# Patient Record
Sex: Male | Born: 1955 | Race: White | Hispanic: No | Marital: Married | Smoking: Never smoker
Health system: Southern US, Community
[De-identification: ages and names within clinical notes are randomized; demographics above are authoritative.]

## PROBLEM LIST (undated history)

## (undated) DIAGNOSIS — M109 Gout, unspecified: Secondary | ICD-10-CM

## (undated) DIAGNOSIS — E785 Hyperlipidemia, unspecified: Secondary | ICD-10-CM

## (undated) DIAGNOSIS — I2510 Atherosclerotic heart disease of native coronary artery without angina pectoris: Secondary | ICD-10-CM

## (undated) DIAGNOSIS — I10 Essential (primary) hypertension: Secondary | ICD-10-CM

## (undated) HISTORY — PX: CARDIAC CATHETERIZATION: SHX172

## (undated) HISTORY — DX: Hyperlipidemia, unspecified: E78.5

## (undated) HISTORY — DX: Atherosclerotic heart disease of native coronary artery without angina pectoris: I25.10

## (undated) HISTORY — PX: OTHER SURGICAL HISTORY: SHX169

## (undated) HISTORY — DX: Gout, unspecified: M10.9

---

## 2001-09-25 ENCOUNTER — Ambulatory Visit (HOSPITAL_COMMUNITY): Admission: RE | Admit: 2001-09-25 | Discharge: 2001-09-25 | Payer: Self-pay | Admitting: Family Medicine

## 2001-09-25 ENCOUNTER — Encounter: Payer: Self-pay | Admitting: Family Medicine

## 2001-11-21 ENCOUNTER — Inpatient Hospital Stay (HOSPITAL_COMMUNITY): Admission: AD | Admit: 2001-11-21 | Discharge: 2001-11-25 | Payer: Self-pay | Admitting: *Deleted

## 2008-09-07 ENCOUNTER — Ambulatory Visit (HOSPITAL_COMMUNITY): Admission: AD | Admit: 2008-09-07 | Discharge: 2008-09-08 | Payer: Self-pay | Admitting: Interventional Cardiology

## 2011-02-20 NOTE — Cardiovascular Report (Signed)
NAMEKEMET, NIJJAR NO.:  [id number]   MEDICAL RECORD NO.:  [id number]          PATIENT TYPE:  INP   LOCATION:  6524                         FACILITY:  [facility]   PHYSICIAN:  Corky Crafts, MDDATE OF BIRTH:  1956/08/09   DATE OF PROCEDURE:  09/07/2008  DATE OF DISCHARGE:                            CARDIAC CATHETERIZATION   PRIMARY CARE PHYSICIAN AND REFERRING PHYSICIAN:  Dario Guardian, MD   PROCEDURES PERFORMED:  1. Left heart catheterization.  2. Left ventriculogram.  3. Abdominal aortogram.  4. Coronary angiogram.  5. Percutaneous coronary intervention of the right coronary artery.   OPERATOR:  Corky Crafts, MD   INDICATIONS:  Angina and coronary artery disease.   PROCEDURE/NARRATIVE:  The risks and benefits of cardiac catheterization  were explained to the patient and informed consent was obtained.  The  patient was brought to the cath lab.  He was prepped and draped in the  usual sterile fashion.  His right groin was infiltrated with 1%  lidocaine.  A 6-French sheath was placed into the right femoral artery  using the modified Seldinger technique.  Left coronary artery  angiography was performed using a JL-4.0 catheter.  The catheter was  advanced to the vessel ostium under fluoroscopic guidance.  Digital  angiography was performed in multiple projections using hand injection  of contrast.  Right coronary artery angiography was performed using a JR-  4.0 catheter in a similar fashion.  A pigtail catheter was advanced to  the ascending aorta and across the aortic valve under fluoroscopic  guidance.  A power injection of contrast was performed in the RAO  position to image the left ventricle.  The catheter was pulled back  under continuous hemodynamic pressure monitoring.  The catheter was then  withdrawn to the abdominal aorta, and a power injection of contrast was  performed in the AP projection.  The PCI was then performed and the  sheath will then be removed using manual compression.  Angiomax was used  for anticoagulation.   FINDINGS:  The left main was widely patent.  There was minimal  irregularity in the distal left main.  The left circumflex was a large vessel.  In the midportion of the  vessel, there was a stent, which was patent, but had about 20-30% in-  stent restenosis.  The OM-1 and OM-2 were small vessels, but widely  patent.  The ramus was a large vessel with mild irregularities.  The left anterior descending was a large vessel, which extended to the  apex, which had only mild irregularities.  The first and second  diagonals were small vessels, but widely patent.  The right coronary artery was a dominant vessel and medium sized.  In  the midportion of the vessel, there was a 95% stenosis.  The distal  vessel appeared underfilled.  left ventriculogram showed normal left ventricular function with an  estimated ejection fraction of 55-60%.   HEMODYNAMICS:  Left ventricular end-diastolic pressure of 10 mmHg.  There was no aortic valve gradient.   PCI NARRATIVE:  A JR-4 guiding catheter was used to engage the right  coronary artery.  A Prowater wire was placed across the stenosis.  A 2.5  x 12-mm Sprinter balloon was placed across the lesion and inflated to 10  atmospheres for 25 seconds.  A 2.5 x 24-mm Endeavor stent was then  placed across the lesion and deployed at 9 atmospheres for 40 seconds.  The stent was postdilated with a 2.75 x 20-mm Voyager [place] balloon  inflated to 18 atmospheres for 30 seconds and then again more proximally  to 18 atmospheres for 30 seconds.  There was an excellent angiographic  result with TIMI III flow.  There was no residual stenosis.   IMPRESSION:  1. Significant right coronary artery disease, revascularized with a      2.5 x 24-mm Endeavor stent and postdilated to 2.8 mm in diameter.  2. Normal left ventricular function.  3. Widely patent prior circumflex stent with  mild in-stent restenosis.  4. No abdominal aortic aneurysm or renal artery stenosis.   RECOMMENDATIONS:  Continue aspirin and Plavix for at least 1 year along  with other secondary prevention including aggressive lipid management.      Corky Crafts, MD  Electronically Signed     JSV/MEDQ  D:  09/07/2008  T:  09/08/2008  Job:  [id number]

## 2011-02-20 NOTE — Discharge Summary (Signed)
NAMEARGEL, Benjamin NO.:  [id number]   MEDICAL RECORD NO.:  [id number]          PATIENT TYPE:  INP   LOCATION:  6524                         FACILITY:  [facility]   PHYSICIAN:  Corky Crafts, MDDATE OF BIRTH:  Jul 06, 1956   DATE OF ADMISSION:  09/07/2008  DATE OF DISCHARGE:  09/08/2008                               DISCHARGE SUMMARY   DISCHARGE DIAGNOSES:  1. Chest pain, resolved.  2. Coronary artery disease status post drug-eluting stent to the right      coronary artery.  3. Dyslipidemia.  4. Gout.   HOSPITAL COURSE:  Benjamin Wheeler is a 55 year old male patient who has a  history of coronary artery disease.  He had a stent placed to his  circumflex back in 2003.  He was seen in the office on September 06, 2008, with exertional chest pain.  Because of his cardiac history and  his new symptoms, it was felt best that he be set up for cardiac  catheterization.   He was set up for cardiac catheterization on September 07, 2008, and he  was found to have a 95% mid RCA stenosis.  The circumflex had a mid  vessel stent with a 20%-30% instent restenosis, otherwise, there was  mild irregularities throughout.  He had a normal EF of 55%-60%.  A drug-  eluting stent was implanted to the right coronary artery successfully  and Dr. Hoyle Barr recommendations were for aspirin and Plavix for at  least 1 year including aggressive medical therapy.   LABORATORY STUDIES:  Sodium 140, potassium 4.0, BUN 17, and creatinine  0.94.  Hemoglobin 14.7, hematocrit 42.3, white count 10.8, and platelets  257.   The patient is being discharged to home in stable and improved  condition.   DISCHARGE MEDICATIONS:  1. Lipitor 20 mg a day.  2. Aspirin 325 mg a day.  3. Plavix 75 mg a day.  4. Indomethacin 50 mg one p.o. with food every 8 hours if needed for      pain.  5. Allopurinol 300 mg a day.  6. He may resume his Vicodin as prior to admission.  He was on      prednisone taper, he  may titrate this down as before admission.  7. Sublingual nitroglycerin p.r.n. chest pain.   DISCHARGE INSTRUCTIONS:  He is to remain on a low sodium, heart-healthy  diet.  Clean cath site gently with soap and water.  Increase activity  slowly.  No driving for 2 days, no lifting over 10 pounds for 1 week.  Activity for cardiac rehabilitation.  Follow up with Dr. Eldridge Dace on  September 22, 2008, at 1:30 p.m.      Guy Franco, P.A.      Corky Crafts, MD  Electronically Signed    LB/MEDQ  D:  09/08/2008  T:  09/08/2008  Job:  [id number]   cc:   Dario Guardian, M.D.

## 2011-02-23 NOTE — Cardiovascular Report (Signed)
[place]  Patient:    Benjamin Wheeler, Benjamin Wheeler Visit Number: [id number] MRN: [id number]          Service Type: MED Location: 2000 2009 01 Attending Physician:  Mora Appl Dictated by:   Francisca December, M.D. Proc. Date: 11/24/01 Admit Date:  11/21/2001   CC:         Meade Maw, M.D.  Meredith Staggers, M.D.   Cardiac Catheterization  PROCEDURE: 1. PCI/stent implantation mid left circumflex coronary artery. 2. Right femoral artery angiogram. 3. Percutaneous closure of right femoral artery.  INDICATIONS:  Mr. Jodeci Roarty is a 55 year old man who has had atypical angina pectoris for the past several weeks.  He underwent a myocardial perfusion study that was positive for partially reversible inferior posterior defect.  Meade Maw, M.D. has completed coronary angiography revealing a 99% stenosis of the mid left circumflex.  There was TIMI 1 distal flow.  He has completed approximately 72 hours of IV heparin and nitroglycerin.  He has received three days of Plavix.  He is brought to the catheterization laboratory at this time for percutaneous revascularization.  DESCRIPTION OF PROCEDURE:  PCI was performed following the percutaneous insertion of a 7 French catheter sheath utilizing an anterior approach over a guiding J-wire into the right femoral artery.  The site had previously been prepped and draped in the usual sterile fashion.  Local anesthesia was obtained with infiltration of 1% lidocaine.  A 7 [place] Voda 3.5 Scimed Wiseguide guiding catheter was advanced through the ascending aorta where the left coronary os was engaged.  A 0.014 inch Scimed Luge intracoronary guide wire was unsuccessful in passing fully across the lesion.  It was therefore removed and exchanged for an ACS Guidant Crossit XT100 intracoronary wire. This successfully crossed the lesion without difficulty.  Initial balloon dilatation was performed with a 3.0 x  20 mm Scimed Maverick intracoronary balloon.  This was positioned across the lesion and inflated to 6 atmospheres for approximately a minute and a half.  This balloon was removed.  It did result in wide patency of the circumflex and distal marginal branch.  It was exchanged for a 3.5 x 15 mm Medtronic S7 intracoronary stent.  This was placed across the lesion carefully and inflated to a peak pressure of 16 atmospheres for less than 1 minute.  The stent balloon was removed and coronary angiography repeated both with and without the guide wire in place in orthogonal views.  This confirmed wide patency.  The guiding catheter was removed.  ACT was obtained and found to be 366 seconds.  The patient had previously been administered 4100 units of heparin prior to the initiation of the angioplasty procedure and a double bolus of Integrilin and was under a constant infusion.  A right femoral arteriogram was performed in a 45 degree RAO angulation. It confirmed the arteriotomy site to be 4 to 5 cm above the bifurcation of the profunda femoris and superficial femoral arteries.  There was no significant atherosclerosis seen within the femoral system.  The guide wire was then advanced through the sheath and the sheath removed.  Perclose was successful in closing the arteriotomy site.  There was good hemostasis and distal pulses intact.  The patient was transported to the ward in stable condition.  ANGIOGRAPHY:  As mentioned the lesion treated was in the midportion of the left circumflex.  There was a large ramus intermedius that was the major vessel on the lateral wall of the  heart.  The circumflex supplied a large obtuse marginal branch.  Following balloon dilatation and stent implantation, there was no residual stenosis, in fact there was a "stepup and stepdown" in the artery easily visible.  FINAL IMPRESSION: 1. Atherosclerotic coronary vascular disease, single vessel. 2. Status post successful  PTCA stent implantation mid left circumflex    coronary artery. 3. Mild typical angina was reproduced with device insertion and balloon    inflation. 4. Successful percutaneous closure (Perclose) right femoral artery. Dictated by:   Francisca December, M.D. Attending Physician:  Meade Maw A DD:  11/24/01 TD:  11/24/01 Job: [id number]

## 2011-02-23 NOTE — Discharge Summary (Signed)
[place]  Patient:    Benjamin Wheeler, Benjamin Wheeler Visit Number: [id number] MRN: [id number]          Service Type: MED Location: 2000 2009 01 Attending Physician:  Meade Maw A Dictated by:   Anselm Lis, N.P. Admit Date:  11/21/2001 Discharge Date: 11/25/2001   CC:         Meredith Staggers, M.D.   Discharge Summary  CONSULTANTS:  Francisca December, M.D.  PRIMARY CARE Lajoy [place]:  Meredith Staggers, M.D.  PROCEDURES: 1. (November 21, 2001) Diagnostic cardiac catheterization revealing occlusion    of the CFX.  EF approximately 55% with posterior hypokinesis. 2. (November 24, 2001) Stent mid CFX.  DISCHARGE DIAGNOSES: 1. Single vessel coronary atherosclerotic heart disease:    a. Diagnostic cardiac catheterization by Dr. Hillary Bow revealing       subtotal occlusion of the circumflex.  This was done on November 21, 2001.  The patient maintained in the hospital over the weekend and on       Monday, November 24, 2001 underwent percutaneous transluminal coronary       angioplasty/stent of the mid circumflex.  There was no significant       disease right coronary artery nor left anterior descending or left main       arteries.  Ejection fraction approximately 55% with posterior       hypokinesis.  Serial cardiac enzymes were negative.  Hospital course       uncomplicated.  He did undergo Perclose of that right groin femoral       artery with residual 3 cm x 3 cm ecchymosis and a 1 cm x 1 cm hematoma.       Good distal pulses. 2. History of gout. 3. Dyslipidemia with fasting cholesterol of 237, triglycerides 229, HDL of 37,    LDL of 154.  Initiated on Lipitor 20 mg p.o. q.d.  He will have a followup    fasting statin profile in approximately six weeks.  PLAN: 1. The patient discharged to home in stable condition. 2. Discharge medications:    a. Plavix 75 mg one p.o. q.d. for four weeks.    b. Nitroglycerin tablet 0.4 mg  sublingual p.r.n. chest pain up to three       tabs in 15 minutes.    c. Enteric-coated aspirin 325 mg once daily.    d. Lipitor 20 mg p.o. q.d. 3. No heavy lifting or pushing or driving in the next few days and activity as    before. 4. Diet: Low fat, low cholesterol. 5. Wound care: May shower. 6. Special instructions: The patient to call if develops large amount of    swelling or bruising in groin area.  He may return to work on December 01, 2001. 7. Followup: Our office will call with followup office visit to be seen by    Dr. Fraser Din in 10-14 dys.  He will also need a fasting statin profile in    approximately six weeks.  HISTORY OF PRESENT ILLNESS/HOSPITAL COURSE:  The patient is a 55 year old gentleman who has history of atypical angina pectoris for the past several weeks prior to admission.  He underwent a myocardial perfusion study that was positive for partially reversible inferior-posterior defect.  He was admitted for elective coronary angiography at [facility] where it was revealed subtotal CFX; other arteries okay.  He was transferred to [facility] where  he maintained on anticoagulants over the weekend as well as aspirin and Plavix.  Serial enzymes were obtained and were negative for MI. On November 24, 2001, he underwent successful stent mid CFX.  Further details as above.  He did well and was discharged home the next day on November 25, 2001.  LABORATORY TESTS AND DATA:  Hemoglobin of 15.8; 15.1 at time of discharge. Hematocrit of 45.3, WBC of 6.9, platelets of 214.  Sodium 141, potassium 5.1; 4.0 at time of discharge.  Chloride of 101, CO2 of 32, BUN of 14, creatinine 1.2, glucose 95.  LFTs within normal range.  PT of 11.8, INR of 1.01, PTT of 41.  NOTE:  Total time preparing discharge greater than 40 minutes including writing out and explanation of discharge instructions to patient, dictating this discharge summary and arranging followup office  visits. Dictated by:   Anselm Lis, N.P. Attending Physician:  Mora Appl DD:  11/25/01 TD:  11/25/01 Job: [id number]

## 2011-07-12 LAB — CBC
HCT: 42.3 % (ref 39.0–52.0)
Hemoglobin: 14.7 g/dL (ref 13.0–17.0)
MCHC: 34.8 g/dL (ref 30.0–36.0)
MCV: 88.9 fL (ref 78.0–100.0)
Platelets: 257 10*3/uL (ref 150–400)
RBC: 4.76 MIL/uL (ref 4.22–5.81)
RDW: 12.9 % (ref 11.5–15.5)
WBC: 10.8 10*3/uL — ABNORMAL HIGH (ref 4.0–10.5)

## 2011-07-12 LAB — BASIC METABOLIC PANEL
BUN: 17 mg/dL (ref 6–23)
CO2: 29 mEq/L (ref 19–32)
Calcium: 9.2 mg/dL (ref 8.4–10.5)
Chloride: 106 mEq/L (ref 96–112)
Creatinine, Ser: 0.94 mg/dL (ref 0.4–1.5)
GFR calc Af Amer: >60 mL/min (ref >60)
GFR calc non Af Amer: >60 mL/min (ref >60)
Glucose, Bld: 171 mg/dL — ABNORMAL HIGH (ref 70–99)
Potassium: 4 mEq/L (ref 3.5–5.1)
Sodium: 140 mEq/L (ref 135–145)

## 2013-10-25 ENCOUNTER — Other Ambulatory Visit: Payer: Self-pay | Admitting: *Deleted

## 2013-10-25 DIAGNOSIS — Z79899 Other long term (current) drug therapy: Secondary | ICD-10-CM

## 2013-10-25 DIAGNOSIS — E782 Mixed hyperlipidemia: Secondary | ICD-10-CM

## 2013-11-03 ENCOUNTER — Other Ambulatory Visit: Payer: Self-pay

## 2013-11-04 ENCOUNTER — Telehealth: Payer: Self-pay | Admitting: *Deleted

## 2013-11-04 NOTE — Telephone Encounter (Signed)
Patient needs refill on lipitor to [facility] [address] in [place]. Thanks, MI

## 2013-11-05 MED ORDER — ATORVASTATIN CALCIUM 80 MG PO TABS: 80.0000 mg | ORAL_TABLET | Freq: Every day | ORAL | Status: DC

## 2013-11-05 NOTE — Telephone Encounter (Signed)
Refilled

## 2013-11-16 ENCOUNTER — Other Ambulatory Visit (INDEPENDENT_AMBULATORY_CARE_PROVIDER_SITE_OTHER): Payer: BC Managed Care – PPO

## 2013-11-16 DIAGNOSIS — Z79899 Other long term (current) drug therapy: Secondary | ICD-10-CM

## 2013-11-16 DIAGNOSIS — E782 Mixed hyperlipidemia: Secondary | ICD-10-CM

## 2013-11-16 LAB — ALT: ALT: 65 U/L — ABNORMAL HIGH (ref 0–53)

## 2013-11-17 LAB — NMR LIPOPROFILE WITH LIPIDS
Cholesterol, Total: 85 mg/dL (ref <200)
HDL Particle Number: 26 umol/L — ABNORMAL LOW (ref >=30.5)
HDL Size: 9.7 nm (ref >=9.2)
HDL-C: 30 mg/dL — ABNORMAL LOW (ref >=40)
LDL (calc): 39 mg/dL (ref <100)
LDL Particle Number: 637 nmol/L (ref <1000)
LDL Size: 19.7 nm — ABNORMAL LOW (ref >20.5)
LP-IR Score: 42 (ref <=45)
Large HDL-P: 3.1 umol/L — ABNORMAL LOW (ref >=4.8)
Large VLDL-P: 1.7 nmol/L (ref <=2.7)
Small LDL Particle Number: 409 nmol/L (ref <=527)
Triglycerides: 82 mg/dL (ref <150)
VLDL Size: 46.3 nm (ref <=46.6)

## 2013-11-27 ENCOUNTER — Telehealth: Payer: Self-pay | Admitting: Cardiology

## 2013-11-27 DIAGNOSIS — E782 Mixed hyperlipidemia: Secondary | ICD-10-CM

## 2013-11-27 MED ORDER — ATORVASTATIN CALCIUM 80 MG PO TABS: 40.0000 mg | ORAL_TABLET | Freq: Every day | ORAL | Status: DC

## 2013-11-27 NOTE — Telephone Encounter (Signed)
Pt notified, meds updated and labs ordered.  

## 2013-11-27 NOTE — Telephone Encounter (Signed)
Message copied by Alcario Drought on Fri Nov 27, 2013 10:09 AM ------      Message from: SMART, Maralyn Sago      Created: Wed Nov 18, 2013  6:17 PM       RF:  CAD, age -LDL goal < 70, non-HDL goal < 100,  LDL-P goal < 1000       Meds:  Lipitor 80 mg qd, fish oil 3 g/d.      LDL-P well below goal of < 1000.  ALT only slightly elevated - previously stable ~ 40 U/L.  Given LDL-P so well controlled, will reduce lipitor to 40 mg qd, and recheck hepatic panel in 4 weeks.  Will plan on rechecking cholesterol and LFTs 4 months later.      Plan:      1. Reduce atorvastatin to 40 mg qd - okay to take 1/2 tablet of 80 mg qd.      2.  Limit alcohol, tylenol use, fried foods, and fast food for the next 4 weeks.      3.  Recheck hepatic panel in 4 weeks.      4.  Recheck both NMR LipoProfile and hepatic panel in 4 months also.      Please notify patient, update meds, and set up both sets of labs. Thanks. ------

## 2013-11-30 ENCOUNTER — Encounter: Payer: Self-pay | Admitting: Cardiology

## 2013-11-30 DIAGNOSIS — I2510 Atherosclerotic heart disease of native coronary artery without angina pectoris: Secondary | ICD-10-CM

## 2013-11-30 DIAGNOSIS — R030 Elevated blood-pressure reading, without diagnosis of hypertension: Secondary | ICD-10-CM | POA: Insufficient documentation

## 2013-11-30 DIAGNOSIS — E782 Mixed hyperlipidemia: Secondary | ICD-10-CM

## 2013-12-28 ENCOUNTER — Other Ambulatory Visit (INDEPENDENT_AMBULATORY_CARE_PROVIDER_SITE_OTHER): Payer: BC Managed Care – PPO

## 2013-12-28 DIAGNOSIS — E782 Mixed hyperlipidemia: Secondary | ICD-10-CM

## 2013-12-28 LAB — HEPATIC FUNCTION PANEL
ALBUMIN: 4.4 g/dL (ref 3.5–5.2)
ALT: 39 U/L (ref 0–53)
AST: 29 U/L (ref 0–37)
Alkaline Phosphatase: 62 U/L (ref 39–117)
Bilirubin, Direct: 0.2 mg/dL (ref 0.0–0.3)
TOTAL PROTEIN: 6.8 g/dL (ref 6.0–8.3)
Total Bilirubin: 1.3 mg/dL — ABNORMAL HIGH (ref 0.3–1.2)

## 2014-01-01 ENCOUNTER — Other Ambulatory Visit: Payer: Self-pay | Admitting: Interventional Cardiology

## 2014-03-29 ENCOUNTER — Other Ambulatory Visit (INDEPENDENT_AMBULATORY_CARE_PROVIDER_SITE_OTHER): Payer: BC Managed Care – PPO

## 2014-03-29 DIAGNOSIS — E782 Mixed hyperlipidemia: Secondary | ICD-10-CM

## 2014-03-29 LAB — HEPATIC FUNCTION PANEL
ALT: 36 U/L (ref 0–53)
AST: 32 U/L (ref 0–37)
Albumin: 4.3 g/dL (ref 3.5–5.2)
Alkaline Phosphatase: 60 U/L (ref 39–117)
Bilirubin, Direct: 0.2 mg/dL (ref 0.0–0.3)
TOTAL PROTEIN: 6.8 g/dL (ref 6.0–8.3)
Total Bilirubin: 1.5 mg/dL — ABNORMAL HIGH (ref 0.2–1.2)

## 2014-03-30 LAB — NMR LIPOPROFILE WITH LIPIDS
Cholesterol, Total: 107 mg/dL (ref <200)
HDL Particle Number: 27.4 umol/L — ABNORMAL LOW (ref >=30.5)
HDL SIZE: 8.8 nm — AB (ref >=9.2)
HDL-C: 37 mg/dL — ABNORMAL LOW (ref >=40)
LDL (calc): 50 mg/dL (ref <100)
LDL PARTICLE NUMBER: 868 nmol/L (ref <1000)
LDL SIZE: 20.1 nm — AB (ref >20.5)
LP-IR Score: 36 (ref <=45)
Large HDL-P: 1.5 umol/L — ABNORMAL LOW (ref >=4.8)
SMALL LDL PARTICLE NUMBER: 549 nmol/L — AB (ref <=527)
Triglycerides: 99 mg/dL (ref <150)
VLDL SIZE: 40.5 nm (ref <=46.6)

## 2014-04-02 ENCOUNTER — Encounter: Payer: Self-pay | Admitting: Cardiology

## 2014-04-02 ENCOUNTER — Other Ambulatory Visit: Payer: Self-pay | Admitting: Cardiology

## 2014-04-02 DIAGNOSIS — E782 Mixed hyperlipidemia: Secondary | ICD-10-CM

## 2014-04-20 ENCOUNTER — Encounter: Payer: Self-pay | Admitting: Interventional Cardiology

## 2014-05-07 ENCOUNTER — Emergency Department: Payer: Self-pay | Admitting: Emergency Medicine

## 2014-05-07 LAB — CBC
HCT: 43.8 % (ref 40.0–52.0)
HGB: 14.9 g/dL (ref 13.0–18.0)
MCH: 30.7 pg (ref 26.0–34.0)
MCHC: 34.1 g/dL (ref 32.0–36.0)
MCV: 90 fL (ref 80–100)
Platelet: 168 10*3/uL (ref 150–440)
RBC: 4.86 10*6/uL (ref 4.40–5.90)
RDW: 13 % (ref 11.5–14.5)
WBC: 7.8 10*3/uL (ref 3.8–10.6)

## 2014-05-07 LAB — URINALYSIS, COMPLETE
BILIRUBIN, UR: NEGATIVE
Bacteria: NONE SEEN
Blood: NEGATIVE
GLUCOSE, UR: NEGATIVE mg/dL (ref 0–75)
Ketone: NEGATIVE
LEUKOCYTE ESTERASE: NEGATIVE
Nitrite: NEGATIVE
Ph: 7 (ref 4.5–8.0)
Protein: NEGATIVE
RBC,UR: 2 /HPF (ref 0–5)
SPECIFIC GRAVITY: 1.02 (ref 1.003–1.030)
WBC UR: 4 /HPF (ref 0–5)

## 2014-05-07 LAB — COMPREHENSIVE METABOLIC PANEL
ALBUMIN: 4.1 g/dL (ref 3.4–5.0)
ALK PHOS: 86 U/L
ALT: 59 U/L
AST: 43 U/L — AB (ref 15–37)
Anion Gap: 7 (ref 7–16)
BUN: 19 mg/dL — AB (ref 7–18)
Bilirubin,Total: 1 mg/dL (ref 0.2–1.0)
Calcium, Total: 8.8 mg/dL (ref 8.5–10.1)
Chloride: 105 mmol/L (ref 98–107)
Co2: 29 mmol/L (ref 21–32)
Creatinine: 1.19 mg/dL (ref 0.60–1.30)
EGFR (Non-African Amer.): >60
Glucose: 157 mg/dL — ABNORMAL HIGH (ref 65–99)
Osmolality: 287 (ref 275–301)
POTASSIUM: 3.6 mmol/L (ref 3.5–5.1)
Sodium: 141 mmol/L (ref 136–145)
Total Protein: 7.5 g/dL (ref 6.4–8.2)

## 2014-05-07 LAB — CK TOTAL AND CKMB (NOT AT ARMC)
CK, Total: 253 U/L
CK-MB: 6.1 ng/mL — ABNORMAL HIGH (ref 0.5–3.6)

## 2014-05-07 LAB — TROPONIN I

## 2014-05-11 ENCOUNTER — Telehealth: Payer: Self-pay

## 2014-05-11 MED ORDER — CLOPIDOGREL BISULFATE 75 MG PO TABS: 75.0000 mg | ORAL_TABLET | Freq: Every day | ORAL | Status: DC

## 2014-05-11 NOTE — Telephone Encounter (Signed)
Refilled

## 2014-05-13 ENCOUNTER — Ambulatory Visit: Payer: Self-pay | Admitting: Interventional Cardiology

## 2014-06-15 ENCOUNTER — Other Ambulatory Visit (INDEPENDENT_AMBULATORY_CARE_PROVIDER_SITE_OTHER): Payer: BC Managed Care – PPO

## 2014-06-15 ENCOUNTER — Ambulatory Visit (INDEPENDENT_AMBULATORY_CARE_PROVIDER_SITE_OTHER): Payer: BC Managed Care – PPO | Admitting: Interventional Cardiology

## 2014-06-15 ENCOUNTER — Encounter: Payer: Self-pay | Admitting: Interventional Cardiology

## 2014-06-15 VITALS — BP 130/78 | HR 51 | Ht 67.0 in | Wt 183.0 lb

## 2014-06-15 DIAGNOSIS — E782 Mixed hyperlipidemia: Secondary | ICD-10-CM

## 2014-06-15 DIAGNOSIS — R030 Elevated blood-pressure reading, without diagnosis of hypertension: Secondary | ICD-10-CM

## 2014-06-15 DIAGNOSIS — I2510 Atherosclerotic heart disease of native coronary artery without angina pectoris: Secondary | ICD-10-CM

## 2014-06-15 LAB — HEPATIC FUNCTION PANEL
ALBUMIN: 4.3 g/dL (ref 3.5–5.2)
ALT: 51 U/L (ref 0–53)
AST: 43 U/L — ABNORMAL HIGH (ref 0–37)
Alkaline Phosphatase: 64 U/L (ref 39–117)
BILIRUBIN TOTAL: 1.9 mg/dL — AB (ref 0.2–1.2)
Bilirubin, Direct: 0.1 mg/dL (ref 0.0–0.3)
Total Protein: 7 g/dL (ref 6.0–8.3)

## 2014-06-15 MED ORDER — CLOPIDOGREL BISULFATE 75 MG PO TABS: 75.0000 mg | ORAL_TABLET | Freq: Every day | ORAL | Status: DC

## 2014-06-15 NOTE — Progress Notes (Signed)
Patient ID: Benjamin Wheeler, male   DOB: 1956/06/14, 58 y.o.   MRN: [id number]    [place], [address] [place], [place]  [address] Phone: [phone] Fax:  [phone]  Date:  06/15/2014   ID:  Benjamin Wheeler, DOB 06-10-1956, MRN [id number]  PCP:  Vena Austria, MD      History of Present Illness: Benjamin Wheeler is a 58 y.o. male who has CAD. He has not been walking as much as he should. He has been working a lot at home and at his job. Feels a mild fullness in the left popliteal area, "fatty tissue." He does not feel that it bothers him to the point where he wants it evaluated.  It has improved since 2014. CAD/ASCVD:  Denies : Chest pain.  Diaphoresis.  Dizziness.  Dyspnea on exertion.  Fatigue.  Leg edema.  Nitroglycerin.  Orthopnea.  Palpitations.  Paroxysmal nocturnal dyspnea.  Syncope.  No bleeding problems.  One episode of dizziness after straining excessively.  He was trying to finish quickly and then got dizzy when he stood up.   His coworkers felt he looked poorly.  He went to the ER.  He was a lttle dehydrated per his report.  No sx since that time.  No CP with this episode.  No sx like prior to his stent.  Wt Readings from Last 3 Encounters:  06/15/14 183 lb (83.008 kg)     Past Medical History  Diagnosis Date  . Hyperlipidemia   . Gout   . CAD (coronary artery disease)     S/P STENT TO CIRCUMFLEX IN 2003, DES RCA 2009    Current Outpatient Prescriptions  Medication Sig Dispense Refill  . aspirin 81 MG tablet Take 81 mg by mouth daily.      Marland Kitchen atorvastatin (LIPITOR) 40 MG tablet 1 tablet once a day  30 tablet  11  . atorvastatin (LIPITOR) 80 MG tablet Take 0.5 tablets (40 mg total) by mouth daily.  30 tablet  1  . cholecalciferol (VITAMIN D) 1000 UNITS tablet Take 2,000 Units by mouth daily.      . clopidogrel (PLAVIX) 75 MG tablet Take 1 tablet (75 mg total) by mouth daily with breakfast.  90 tablet  0  . indomethacin (INDOCIN) 50 MG  capsule Take 50 mg by mouth 3 (three) times daily as needed.      . Omega-3 Fatty Acids (FISH OIL) 1000 MG CAPS Take 3,000 mg by mouth 2 (two) times daily.      . Thiamine HCl (VITAMIN B-1) 250 MG tablet Take 500 mg by mouth daily.       No current facility-administered medications for this visit.    Allergies:   No Known Allergies  Social History:  The patient  reports that he has never smoked. He does not have any smokeless tobacco history on file. He reports that he does not drink alcohol or use illicit drugs.   Family History:  The patient's family history includes CAD in his father.   ROS:  Please see the history of present illness.  No nausea, vomiting.  No fevers, chills.  No focal weakness.  No dysuria.    All other systems reviewed and negative.   PHYSICAL EXAM: VS:  BP 130/78  Pulse 51  Ht 5\' 7"  (1.702 m)  Wt 183 lb (83.008 kg)  BMI 28.66 kg/m2 Well nourished, well developed, in no acute distress HEENT: normal Neck: no JVD, no carotid bruits Cardiac:  normal S1, S2; RRR;  Lungs:  clear to auscultation bilaterally, no wheezing, rhonchi or rales Abd: soft, nontender, no hepatomegaly Ext: no edema, decreased prominence of the left popliteal area; right arm bruise Skin: warm and dry Neuro:   no focal abnormalities noted  EKG:  Sinus bradycardia     ASSESSMENT AND PLAN:  Coronary atherosclerosis of native coronary artery  Continue Aspirin Tablet Delayed Release, 81 MG, 1 tablet, Orally, Once a day Continue Plavix Tablet, 75 MG, 1 tablet, Orally, Once a day IMAGING: EKG    Stegall,Amy 05/15/2013 09:42:56 AM > Anberlyn Feimster,JAY 05/15/2013 10:05:52 AM > NSR, no ST segment changes   Notes: No angina. No bleeding problems. Discussed decreasing aspirin but he prefers to stay on DAPT.   2. Hypercholesterolemia, Mixed  Continue Lipitor Tablet, 40 MG, 1 tablet, Orally, Once a day Continue Fish Oil Capsule, 1000 MG, 3 caps, Orally, BID Notes: Last particle number in 8/14  controlled. Today's result pending.    3. Blood pressure elevated in the past without history of HTN  Notes: Discussed lifestyle changes.   Not checking at home.     4. Limb pain - improved Notes: he does have some mild swelling behind the left knee,is not pitting. There is no pitting edema below the knee and shin. We discussed the possibility of a Baker's cyst causing pain there. We discussed getting ultrasound. He would like to hold off at this time. If symptoms get worse, he'll let us know. He thinks things have improved since 2014   Preventive Medicine  Adult topics discussed:  Diet: healthy diet, low calorie, low fat, 64 ounces of water daily.  Exercise: 5 days a week, at least 30 minutes of aerobic exercise.      Signed, Mina Marble, MD, [facility] 06/15/2014 8:19 AM

## 2014-06-15 NOTE — Patient Instructions (Signed)
Your physician recommends that you return for a FASTING NMR lipo and hepatic panel today.   Your physician recommends that you continue on your current medications as directed. Please refer to the Current Medication list given to you today.   Your physician wants you to follow-up in: 1 year with Dr. Irish Lack. You will receive a reminder letter in the mail two months in advance. If you don't receive a letter, please call our office to schedule the follow-up appointment.

## 2014-06-17 LAB — NMR LIPOPROFILE WITH LIPIDS
Cholesterol, Total: 119 mg/dL (ref 100–199)
HDL Particle Number: 26.1 umol/L — ABNORMAL LOW (ref >=30.5)
HDL SIZE: 8.7 nm — AB (ref >=9.2)
HDL-C: 34 mg/dL — AB (ref >39)
LDL CALC: 56 mg/dL (ref 0–99)
LDL PARTICLE NUMBER: 856 nmol/L (ref <1000)
LDL SIZE: 20.1 nm (ref >=20.8)
LP-IR Score: 64 — ABNORMAL HIGH (ref <=45)
Large HDL-P: 3.6 umol/L — ABNORMAL LOW (ref >=4.8)
Large VLDL-P: 2.9 nmol/L — ABNORMAL HIGH (ref <=2.7)
Small LDL Particle Number: 534 nmol/L — ABNORMAL HIGH (ref <=527)
Triglycerides: 143 mg/dL (ref 0–149)
VLDL Size: 47.4 nm — ABNORMAL HIGH (ref <=46.6)

## 2014-06-22 ENCOUNTER — Other Ambulatory Visit: Payer: Self-pay | Admitting: Cardiology

## 2014-06-22 ENCOUNTER — Encounter: Payer: Self-pay | Admitting: Cardiology

## 2014-06-22 DIAGNOSIS — E782 Mixed hyperlipidemia: Secondary | ICD-10-CM

## 2014-10-04 ENCOUNTER — Other Ambulatory Visit: Payer: BC Managed Care – PPO

## 2014-12-14 ENCOUNTER — Other Ambulatory Visit (INDEPENDENT_AMBULATORY_CARE_PROVIDER_SITE_OTHER): Payer: BLUE CROSS/BLUE SHIELD

## 2014-12-14 DIAGNOSIS — E782 Mixed hyperlipidemia: Secondary | ICD-10-CM

## 2014-12-14 LAB — HEPATIC FUNCTION PANEL
ALBUMIN: 4.5 g/dL (ref 3.5–5.2)
ALT: 43 U/L (ref 0–53)
AST: 32 U/L (ref 0–37)
Alkaline Phosphatase: 70 U/L (ref 39–117)
Bilirubin, Direct: 0.3 mg/dL (ref 0.0–0.3)
Total Bilirubin: 1.5 mg/dL — ABNORMAL HIGH (ref 0.2–1.2)
Total Protein: 6.8 g/dL (ref 6.0–8.3)

## 2014-12-16 LAB — NMR LIPOPROFILE WITH LIPIDS
Cholesterol, Total: 101 mg/dL (ref 100–199)
HDL PARTICLE NUMBER: 25.4 umol/L — AB (ref >=30.5)
HDL SIZE: 8.9 nm — AB (ref >=9.2)
HDL-C: 34 mg/dL — ABNORMAL LOW (ref >39)
LARGE HDL: 2.3 umol/L — AB (ref >=4.8)
LDL (calc): 52 mg/dL (ref 0–99)
LDL Particle Number: 937 nmol/L (ref <1000)
LDL Size: 19.9 nm (ref >=20.8)
LP-IR SCORE: 49 — AB (ref <=45)
Large VLDL-P: 1.3 nmol/L (ref <=2.7)
Small LDL Particle Number: 673 nmol/L — ABNORMAL HIGH (ref <=527)
Triglycerides: 77 mg/dL (ref 0–149)
VLDL SIZE: 48.1 nm — AB (ref <=46.6)

## 2015-01-02 ENCOUNTER — Other Ambulatory Visit: Payer: Self-pay | Admitting: Interventional Cardiology

## 2015-01-04 NOTE — Telephone Encounter (Signed)
Benjamin Booze, MD at 06/15/2014 7:48 AM     atorvastatin (LIPITOR) 40 MG tablet 1 tablet once a day 2. Hypercholesterolemia, Mixed  Continue Lipitor Tablet, 40 MG, 1 tablet, Orally, Once a day

## 2015-01-07 ENCOUNTER — Encounter: Payer: Self-pay | Admitting: *Deleted

## 2015-07-01 ENCOUNTER — Other Ambulatory Visit: Payer: Self-pay | Admitting: Interventional Cardiology

## 2015-07-28 ENCOUNTER — Other Ambulatory Visit: Payer: Self-pay | Admitting: Interventional Cardiology

## 2015-08-20 ENCOUNTER — Other Ambulatory Visit: Payer: Self-pay | Admitting: Interventional Cardiology

## 2015-09-05 ENCOUNTER — Other Ambulatory Visit: Payer: Self-pay | Admitting: *Deleted

## 2015-09-05 ENCOUNTER — Telehealth: Payer: Self-pay | Admitting: Interventional Cardiology

## 2015-09-05 MED ORDER — CLOPIDOGREL BISULFATE 75 MG PO TABS: ORAL_TABLET | ORAL | Status: DC

## 2015-09-05 MED ORDER — ATORVASTATIN CALCIUM 40 MG PO TABS
40.0000 mg | ORAL_TABLET | Freq: Every day | ORAL | Status: DC
Start: 2015-09-05 — End: 2015-11-23

## 2015-09-05 NOTE — Telephone Encounter (Signed)
°*  STAT* If patient is at the pharmacy, call can be transferred to refill team.   1. Which medications need to be refilled? (please list name of each medication and dose if known)  Atorvastatin 40mg   Clopidogrel 75mg   2. Which pharmacy/location (including street and city if local pharmacy) is medication to [facility] sent to?[facility] [address] [address]  3. Do they need a 30 day or 90 day supply? 90 day if possible

## 2015-09-19 ENCOUNTER — Other Ambulatory Visit: Payer: Self-pay | Admitting: *Deleted

## 2015-09-19 MED ORDER — CLOPIDOGREL BISULFATE 75 MG PO TABS: ORAL_TABLET | ORAL | Status: DC

## 2015-10-20 ENCOUNTER — Other Ambulatory Visit: Payer: Self-pay | Admitting: Interventional Cardiology

## 2015-11-17 ENCOUNTER — Other Ambulatory Visit: Payer: Self-pay | Admitting: Interventional Cardiology

## 2015-11-23 ENCOUNTER — Ambulatory Visit (INDEPENDENT_AMBULATORY_CARE_PROVIDER_SITE_OTHER): Payer: BLUE CROSS/BLUE SHIELD | Admitting: Interventional Cardiology

## 2015-11-23 ENCOUNTER — Encounter: Payer: Self-pay | Admitting: Interventional Cardiology

## 2015-11-23 VITALS — BP 130/91 | HR 60 | Ht 67.0 in | Wt 188.0 lb

## 2015-11-23 DIAGNOSIS — E785 Hyperlipidemia, unspecified: Secondary | ICD-10-CM | POA: Diagnosis not present

## 2015-11-23 DIAGNOSIS — R030 Elevated blood-pressure reading, without diagnosis of hypertension: Secondary | ICD-10-CM

## 2015-11-23 DIAGNOSIS — I2510 Atherosclerotic heart disease of native coronary artery without angina pectoris: Secondary | ICD-10-CM

## 2015-11-23 DIAGNOSIS — E782 Mixed hyperlipidemia: Secondary | ICD-10-CM | POA: Diagnosis not present

## 2015-11-23 LAB — LIPID PANEL
CHOL/HDL RATIO: 3.4 ratio (ref <=5.0)
CHOLESTEROL: 111 mg/dL — AB (ref 125–200)
HDL: 33 mg/dL — AB (ref >=40)
LDL Cholesterol: 61 mg/dL (ref <130)
TRIGLYCERIDES: 87 mg/dL (ref <150)
VLDL: 17 mg/dL (ref <30)

## 2015-11-23 LAB — HEPATIC FUNCTION PANEL
ALBUMIN: 4.4 g/dL (ref 3.6–5.1)
ALT: 32 U/L (ref 9–46)
AST: 25 U/L (ref 10–35)
Alkaline Phosphatase: 65 U/L (ref 40–115)
Bilirubin, Direct: 0.3 mg/dL — ABNORMAL HIGH (ref <=0.2)
Indirect Bilirubin: 0.8 mg/dL (ref 0.2–1.2)
TOTAL PROTEIN: 6.8 g/dL (ref 6.1–8.1)
Total Bilirubin: 1.1 mg/dL (ref 0.2–1.2)

## 2015-11-23 LAB — BASIC METABOLIC PANEL
BUN: 18 mg/dL (ref 7–25)
CO2: 25 mmol/L (ref 20–31)
Calcium: 9.4 mg/dL (ref 8.6–10.3)
Chloride: 105 mmol/L (ref 98–110)
Creat: 1.23 mg/dL (ref 0.70–1.25)
GLUCOSE: 138 mg/dL — AB (ref 65–99)
POTASSIUM: 4.6 mmol/L (ref 3.5–5.3)
SODIUM: 138 mmol/L (ref 135–146)

## 2015-11-23 MED ORDER — CLOPIDOGREL BISULFATE 75 MG PO TABS: ORAL_TABLET | ORAL | Status: DC

## 2015-11-23 MED ORDER — ATORVASTATIN CALCIUM 40 MG PO TABS
40.0000 mg | ORAL_TABLET | Freq: Every day | ORAL | Status: DC
Start: 2015-11-23 — End: 2016-11-21

## 2015-11-23 NOTE — Patient Instructions (Addendum)
Medication Instructions:  Your physician has recommended you make the following change in your medication: Stop aspirin    Labwork: Lab work to be done today--BMP, Lipid and Liver profiles  Testing/Procedures: Your physician has requested that you have an exercise tolerance test. For further information please visit [email or web address]. Please also follow instruction sheet, as given.    Follow-Up: Your physician wants you to follow-up in: 12 months.  You will receive a reminder letter in the mail two months in advance. If you don't receive a letter, please call our office to schedule the follow-up appointment.   Any Other Special Instructions Will Be Listed Below (If Applicable).     If you need a refill on your cardiac medications before your next appointment, please call your pharmacy.

## 2015-11-23 NOTE — Progress Notes (Signed)
Patient ID: Benjamin Wheeler, male   DOB: Aug 09, 1956, 59 y.o.   MRN: [id number]     Cardiology Office Note   Date:  11/23/2015   ID:  Benjamin Wheeler, DOB 1956/03/12, MRN [id number]  PCP:  [name], MD    No chief complaint on file. f/u CAD   Wt Readings from Last 3 Encounters:  11/23/15 188 lb (85.276 kg)  06/15/14 183 lb (83.008 kg)       History of Present Illness: Benjamin Wheeler is a 60 y.o. male  who has CAD, most recent stent in 12/09. He has not been walking as much as he should. He has been working a lot at home and at his job; and is tired because he is on his feet all day.   Denies : Chest pain.  Diaphoresis.  Dizziness.  Dyspnea on exertion.  Fatigue.  Leg edema.  Nitroglycerin.  Orthopnea.  Palpitations.  Paroxysmal nocturnal dyspnea.  Syncope.  No bleeding problems.  No further sx of dizziness.  Overall, he feels well. No sx like prior to his stent.  H ehas not had a stress test since 2003.    Past Medical History  Diagnosis Date  . Hyperlipidemia   . Gout   . CAD (coronary artery disease)     S/P STENT TO CIRCUMFLEX IN 2003, DES RCA 2009    Past Surgical History  Procedure Laterality Date  . Cardiac catheterization      S/P STENT TO CIRCUMFLEX IN 2003, DES RCA 2009     Current Outpatient Prescriptions  Medication Sig Dispense Refill  . aspirin 81 MG tablet Take 81 mg by mouth daily.    Marland Kitchen atorvastatin (LIPITOR) 40 MG tablet Take 1 tablet (40 mg total) by mouth daily. 14 tablet 0  . clopidogrel (PLAVIX) 75 MG tablet TAKE 1 TABLET (75 MG TOTAL) BY MOUTH DAILY WITH BREAKFAST. 30 tablet 0  . Omega-3 Fatty Acids (FISH OIL) 1000 MG CAPS Take 3,000 mg by mouth 2 (two) times daily.     No current facility-administered medications for this visit.    Allergies:   Review of patient's allergies indicates no known allergies.    Social History:  The patient  reports that he has never smoked. He does not have any smokeless tobacco history  on file. He reports that he does not drink alcohol or use illicit drugs.   Family History:  The patient's family history includes CAD in his father; Hypertension in his father and mother. There is no history of Heart attack or Stroke.    ROS:  Please see the history of present illness.   Otherwise, review of systems are positive for right knee swelling- received cortisone shot.   All other systems are reviewed and negative.    PHYSICAL EXAM: VS:  BP 130/91 mmHg  Pulse 60  Ht 5\' 7"  (1.702 m)  Wt 188 lb (85.276 kg)  BMI 29.44 kg/m2 , BMI Body mass index is 29.44 kg/(m^2). GEN: Well nourished, well developed, in no acute distress HEENT: normal Neck: no JVD, carotid bruits, or masses Cardiac: RRR; no murmurs, rubs, or gallops,no edema  Respiratory:  clear to auscultation bilaterally, normal work of breathing GI: soft, nontender, nondistended, + BS MS: no deformity or atrophy Skin: warm and dry, no rash Neuro:  Strength and sensation are intact Psych: euthymic mood, full affect   EKG:   The ekg ordered today demonstrates sinus bradycardia, no ST segment changes   Recent Labs: 12/14/2014: ALT  43   Lipid Panel    Component Value Date/Time   CHOL 101 12/14/2014 1507   TRIG 77 12/14/2014 1507   HDL 34* 12/14/2014 1507   LDLCALC 52 12/14/2014 1507     Other studies Reviewed: Additional studies/ records that were reviewed today with results demonstrating: Cath results reviewed.   ASSESSMENT AND PLAN:  1. CAD: No angina.  It has been many years since an ischemia evaluation.  Will plan for ETT.  Continue clopidogrel.  Stop aspirin.  2. Hyperlipidemia: Continue atorvastatin and fish oil.  Will check labs today.  Lipids controlled in 3/16. 3. High BP reading without HTN: Diastolic mildly elevated.  Try to increase exercise.  No need for meds at this time.    Current medicines are reviewed at length with the patient today.  The patient concerns regarding his medicines were  addressed.  The following changes have been made:  Stop aspirin  Labs/ tests ordered today include:  No orders of the defined types were placed in this encounter.    Recommend 150 minutes/week of aerobic exercise Low fat, low carb, high fiber diet recommended  Disposition:   FU in for stress test, and 1 year   Teresita Madura., MD  11/23/2015 9:26 AM    [facility] [place], [place], [place]  [address] Phone: [phone]; Fax: [phone]

## 2015-12-13 ENCOUNTER — Ambulatory Visit (INDEPENDENT_AMBULATORY_CARE_PROVIDER_SITE_OTHER): Payer: BLUE CROSS/BLUE SHIELD

## 2015-12-13 DIAGNOSIS — I2510 Atherosclerotic heart disease of native coronary artery without angina pectoris: Secondary | ICD-10-CM | POA: Diagnosis not present

## 2015-12-13 LAB — EXERCISE TOLERANCE TEST
CHL CUP MPHR: 160 {beats}/min
CHL CUP RESTING HR STRESS: 53 {beats}/min
CHL CUP STRESS STAGE 1 HR: 65 {beats}/min
CHL CUP STRESS STAGE 2 GRADE: 0 %
CHL CUP STRESS STAGE 2 SPEED: 1 mph
CHL CUP STRESS STAGE 3 GRADE: 0 %
CHL CUP STRESS STAGE 3 HR: 74 {beats}/min
CHL CUP STRESS STAGE 5 SPEED: 2.5 mph
CHL CUP STRESS STAGE 6 GRADE: 14 %
CHL CUP STRESS STAGE 6 HR: 137 {beats}/min
CHL CUP STRESS STAGE 7 GRADE: 16 %
CHL CUP STRESS STAGE 7 HR: 155 {beats}/min
CHL CUP STRESS STAGE 8 SBP: 206 mmHg
CSEPED: 10 min
CSEPEDS: 0 s
CSEPEW: 11.7 METS
CSEPHR: 96 %
CSEPPHR: 155 {beats}/min
Percent of predicted max HR: 96 %
RPE: 15
Stage 1 DBP: 97 mmHg
Stage 1 Grade: 0 %
Stage 1 SBP: 150 mmHg
Stage 1 Speed: 0 mph
Stage 2 HR: 73 {beats}/min
Stage 3 Speed: 1 mph
Stage 4 DBP: 96 mmHg
Stage 4 Grade: 10 %
Stage 4 HR: 90 {beats}/min
Stage 4 SBP: 177 mmHg
Stage 4 Speed: 1.7 mph
Stage 5 DBP: 96 mmHg
Stage 5 Grade: 12 %
Stage 5 HR: 105 {beats}/min
Stage 5 SBP: 176 mmHg
Stage 6 DBP: 93 mmHg
Stage 6 SBP: 225 mmHg
Stage 6 Speed: 3.4 mph
Stage 7 Speed: 4.2 mph
Stage 8 DBP: 85 mmHg
Stage 8 Grade: 0 %
Stage 8 HR: 117 {beats}/min
Stage 8 Speed: 1.5 mph
Stage 9 DBP: 93 mmHg
Stage 9 Grade: 0 %
Stage 9 HR: 79 {beats}/min
Stage 9 SBP: 152 mmHg
Stage 9 Speed: 0 mph

## 2015-12-14 ENCOUNTER — Telehealth: Payer: Self-pay | Admitting: Interventional Cardiology

## 2015-12-14 NOTE — Telephone Encounter (Signed)
-----   Message from Jettie Booze, MD sent at 12/13/2015  5:19 PM EST ----- Normal stress test.  F/u in one year.  Contact us if he has any sx before that time.

## 2015-12-14 NOTE — Telephone Encounter (Signed)
Returning your call from this morning. °

## 2015-12-14 NOTE — Telephone Encounter (Signed)
Returned pt's call and discussed is stress test results.  Pt verbalized understanding.

## 2016-01-11 ENCOUNTER — Telehealth: Payer: Self-pay

## 2016-01-11 NOTE — Telephone Encounter (Signed)
**Note De-Identified Azhar Yogi Obfuscation** The pt is scheduled to have a colonoscopy on 02/22/13 with Dr Candace Cruise with [facility].  Type of anesthesia: Monitored  They need to know the following:  1. Risk assessment from Dr Perfecto Kingdom?  2. Can the pt stop taking Plavix 5 days prior to procedure?  3. If bridging is necessary, which agent: Lovenox or Fragmin? If bridging is recommended, please fax our office (ATTN [phone]) the bridging regimen the pt has been instructed to follow, for our physician to review prior to his procedure.   Please advise.

## 2016-01-11 NOTE — Telephone Encounter (Signed)
**Note De-Identified Renton Berkley Obfuscation** I have faxed this message manually to [place]. ATTNLoletha Carrow @ [phone]. I did receive confirmation that the fax went through successfully.

## 2016-01-11 NOTE — Telephone Encounter (Signed)
OK to hold Plavix 5 days prior to colonoscopy.  

## 2016-02-23 ENCOUNTER — Encounter: Admission: RE | Payer: Self-pay | Source: Ambulatory Visit

## 2016-02-23 ENCOUNTER — Ambulatory Visit
Admission: RE | Admit: 2016-02-23 | Payer: BLUE CROSS/BLUE SHIELD | Source: Ambulatory Visit | Admitting: Gastroenterology

## 2016-02-23 SURGERY — COLONOSCOPY WITH PROPOFOL
Anesthesia: General

## 2016-05-03 ENCOUNTER — Encounter: Payer: Self-pay | Admitting: *Deleted

## 2016-05-04 ENCOUNTER — Ambulatory Visit
Admission: RE | Admit: 2016-05-04 | Payer: BLUE CROSS/BLUE SHIELD | Source: Ambulatory Visit | Admitting: Gastroenterology

## 2016-05-04 ENCOUNTER — Encounter: Admission: RE | Payer: Self-pay | Source: Ambulatory Visit

## 2016-05-04 ENCOUNTER — Encounter: Payer: Self-pay | Admitting: *Deleted

## 2016-05-04 ENCOUNTER — Ambulatory Visit: Payer: BLUE CROSS/BLUE SHIELD | Admitting: Anesthesiology

## 2016-05-04 ENCOUNTER — Ambulatory Visit
Admission: RE | Admit: 2016-05-04 | Discharge: 2016-05-04 | Disposition: A | Discharge status: Home / Self Care | Payer: BLUE CROSS/BLUE SHIELD | Source: Ambulatory Visit | Attending: Gastroenterology | Admitting: Gastroenterology

## 2016-05-04 ENCOUNTER — Encounter
Admission: RE | Disposition: A | Discharge status: Home / Self Care | Payer: Self-pay | Source: Ambulatory Visit | Attending: Gastroenterology

## 2016-05-04 DIAGNOSIS — M109 Gout, unspecified: Secondary | ICD-10-CM | POA: Insufficient documentation

## 2016-05-04 DIAGNOSIS — E785 Hyperlipidemia, unspecified: Secondary | ICD-10-CM | POA: Insufficient documentation

## 2016-05-04 DIAGNOSIS — Z79899 Other long term (current) drug therapy: Secondary | ICD-10-CM | POA: Insufficient documentation

## 2016-05-04 DIAGNOSIS — I2510 Atherosclerotic heart disease of native coronary artery without angina pectoris: Secondary | ICD-10-CM | POA: Diagnosis not present

## 2016-05-04 DIAGNOSIS — D123 Benign neoplasm of transverse colon: Secondary | ICD-10-CM | POA: Diagnosis not present

## 2016-05-04 DIAGNOSIS — Z1211 Encounter for screening for malignant neoplasm of colon: Secondary | ICD-10-CM | POA: Insufficient documentation

## 2016-05-04 DIAGNOSIS — Z955 Presence of coronary angioplasty implant and graft: Secondary | ICD-10-CM | POA: Insufficient documentation

## 2016-05-04 DIAGNOSIS — I10 Essential (primary) hypertension: Secondary | ICD-10-CM | POA: Diagnosis not present

## 2016-05-04 HISTORY — DX: Essential (primary) hypertension: I10

## 2016-05-04 HISTORY — PX: COLONOSCOPY WITH PROPOFOL: SHX5780

## 2016-05-04 HISTORY — DX: Hyperlipidemia, unspecified: E78.5

## 2016-05-04 SURGERY — COLONOSCOPY WITH PROPOFOL
Anesthesia: General

## 2016-05-04 MED ORDER — SODIUM CHLORIDE 0.9 % IV SOLN: INTRAVENOUS | Status: DC

## 2016-05-04 MED ORDER — PROPOFOL 10 MG/ML IV BOLUS
INTRAVENOUS | Status: DC | PRN
  Administered 2016-05-04: 100 mg via INTRAVENOUS

## 2016-05-04 MED ORDER — SODIUM CHLORIDE 0.9 % IV SOLN
INTRAVENOUS | Status: DC
  Administered 2016-05-04: 1000 mL via INTRAVENOUS

## 2016-05-04 MED ORDER — PROPOFOL 500 MG/50ML IV EMUL
INTRAVENOUS | Status: DC | PRN
  Administered 2016-05-04: 100 ug/kg/min via INTRAVENOUS

## 2016-05-04 NOTE — Anesthesia Postprocedure Evaluation (Signed)
Anesthesia Post Note  Patient: Benjamin Wheeler  Procedure(s) Performed: Procedure(s) (LRB): COLONOSCOPY WITH PROPOFOL (N/A)  Patient location during evaluation: Endoscopy Anesthesia Type: General Level of consciousness: awake and alert Pain management: pain level controlled Vital Signs Assessment: post-procedure vital signs reviewed and stable Respiratory status: spontaneous breathing, nonlabored ventilation, respiratory function stable and patient connected to nasal cannula oxygen Cardiovascular status: blood pressure returned to baseline and stable Postop Assessment: no signs of nausea or vomiting Anesthetic complications: no    Last Vitals:  Vitals:   05/04/16 1603 05/04/16 1613  BP: 117/84 116/83  Pulse: (!) 56 (!) 56  Resp: 18 18  Temp:      Last Pain:  Vitals:   05/04/16 1543  TempSrc: Tympanic                 Martha Clan

## 2016-05-04 NOTE — Op Note (Signed)
[facility] Patient Name: Benjamin Wheeler Procedure Date: 05/04/2016 2:56 PM MRN: [id number] Account #: [id number] Date of Birth: [birth year] Admit Type: Outpatient Age: 60 Room: [facility] ENDO ROOM 4 Gender: Male Note Status: Finalized Procedure:            Colonoscopy Indications:          Screening for colorectal malignant neoplasm Providers:            [name], MD Referring MD:         Irven Easterly. Kary Kos, MD (Referring MD) Medicines:            Monitored Anesthesia Care Complications:        No immediate complications. Procedure:            Pre-Anesthesia Assessment:                       - ASA Grade Assessment: II - A patient with mild                        systemic disease.                       After obtaining informed consent, the colonoscope was                        passed under direct vision. Throughout the procedure,                        the patient's blood pressure, pulse, and oxygen                        saturations were monitored continuously. The                        Colonoscope was introduced through the anus and                        advanced to the the cecum, identified by appendiceal                        orifice and ileocecal valve. The colonoscopy was                        performed with moderate difficulty due to the patient's                        position intolerance. Successful completion of the                        procedure was aided by using manual pressure,                        withdrawing and reinserting the scope and changing                        endoscopes. The patient tolerated the procedure. The                        quality of the bowel preparation was good. Findings:      A 1 mm polyp was found in the transverse colon. The  polyp was sessile.       The polyp was removed with a cold biopsy forceps. Resection and       retrieval were complete.      The sigmoid colon, descending colon and  transverse colon were       significantly redundant. When the scoope was placed into the miid       transverse, this resulted in rretching. The scope was changed and       reapproached with a different path and this seemed to resolve the       retching. Impression:           - One 1 mm polyp in the transverse colon, removed with                        a cold biopsy forceps. Resected and retrieved. Recommendation:       - Discharge patient to home.                       - Await pathology results. Procedure Code(s):    --- Professional ---                       [phone], Colonoscopy, flexible; with biopsy, single or                        multiple Diagnosis Code(s):    --- Professional ---                       Z12.11, Encounter for screening for malignant neoplasm                        of colon                       D12.3, Benign neoplasm of transverse colon (hepatic                        flexure or splenic flexure) CPT copyright 2016 American Medical Association. All rights reserved. The codes documented in this report are preliminary and upon coder review may  be revised to meet current compliance requirements. [name], MD 05/04/2016 3:43:36 PM This report has been signed electronically. Number of Addenda: 0 Note Initiated On: 05/04/2016 2:56 PM Scope Withdrawal Time: 0 hours 4 minutes 47 seconds  Total Procedure Duration: 0 hours 32 minutes 22 seconds       [facility]

## 2016-05-04 NOTE — H&P (Signed)
Outpatient short stay form Pre-procedure 05/04/2016 2:59 PM  [name] MD  Primary Physician: Dr. [name]  Reason for visit:  Colonoscopy  History of present illness:  Patient is a 60 year old male presenting today for his first colonoscopy. He tolerated his prep well. Take Plavix however the last time he took it was sent 7 days ago. Takes noaspirin products or other blood thinning agents.    Current Facility-Administered Medications:  .  0.9 %  sodium chloride infusion, , Intravenous, Continuous, [name], MD, Last Rate: 20 mL/hr at 05/04/16 1444, 1,000 mL at 05/04/16 1444 .  0.9 %  sodium chloride infusion, , Intravenous, Continuous, [name], MD  Prescriptions Prior to Admission  Medication Sig Dispense Refill Last Dose  . atorvastatin (LIPITOR) 40 MG tablet Take 1 tablet (40 mg total) by mouth daily. 90 tablet 3 Past Month at Unknown time  . Cholecalciferol 1000 UNIT/10ML LIQD Take 1,000 Units by mouth.   Past Month at Unknown time  . clopidogrel (PLAVIX) 75 MG tablet TAKE 1 TABLET (75 MG TOTAL) BY MOUTH DAILY WITH BREAKFAST. 90 tablet 3 Past Month at Unknown time  . Omega-3 Fatty Acids (FISH OIL) 1000 MG CAPS Take 3,000 mg by mouth 2 (two) times daily.   Past Month at Unknown time     No Known Allergies   Past Medical History:  Diagnosis Date  . CAD (coronary artery disease)    S/P STENT TO CIRCUMFLEX IN 2003, DES RCA 2009  . Elevated lipids   . Gout   . Gout   . Hyperlipidemia   . Hypertension     Review of systems:      Physical Exam    Heart and lungs: Regular rate and rhythm without rub or gallop, lungs are bilaterally clear.    HEENT: Normocephalic atraumatic eyes are anicteric    Other:     Pertinant exam for procedure: Soft nontender nondistended bowel sounds positive normoactive.    Planned proceedures: Colonoscopy and indicated procedures.    [name], MD Gastroenterology 05/04/2016  2:59 PM

## 2016-05-04 NOTE — Transfer of Care (Signed)
Immediate Anesthesia Transfer of Care Note  Patient: Benjamin Wheeler  Procedure(s) Performed: Procedure(s): COLONOSCOPY WITH PROPOFOL (N/A)  Patient Location: PACU and Endoscopy Unit  Anesthesia Type:General  Level of Consciousness: sedated and responds to stimulation  Airway & Oxygen Therapy: Patient Spontanous Breathing  Post-op Assessment: Report given to RN and Post -op Vital signs reviewed and stable  Post vital signs: Reviewed and stable  Last Vitals:  Vitals:   05/04/16 1428 05/04/16 1543  BP: 132/79 122/80  Pulse: (!) 59 63  Resp: 18 16  Temp: 36.3 C     Last Pain:  Vitals:   05/04/16 1428  TempSrc: Tympanic         Complications: No apparent anesthesia complications

## 2016-05-04 NOTE — Anesthesia Preprocedure Evaluation (Signed)
Anesthesia Evaluation  Patient identified by MRN, date of birth, ID band Patient awake    Reviewed: Allergy & Precautions, H&P , NPO status , Patient's Chart, lab work & pertinent test results, reviewed documented beta blocker date and time Preop documentation limited or incomplete due to emergent nature of procedure.  History of Anesthesia Complications Negative for: history of anesthetic complications  Airway Mallampati: I  TM Distance: >3 FB Neck ROM: full    Dental no notable dental hx. (+) Teeth Intact   Pulmonary neg pulmonary ROS,    Pulmonary exam normal breath sounds clear to auscultation       Cardiovascular Exercise Tolerance: Good hypertension, (-) angina+ CAD and + Cardiac Stents  (-) Past MI and (-) CABG Normal cardiovascular exam(-) dysrhythmias (-) Valvular Problems/Murmurs Rhythm:regular Rate:Normal     Neuro/Psych negative neurological ROS  negative psych ROS   GI/Hepatic negative GI ROS, Neg liver ROS,   Endo/Other  negative endocrine ROS  Renal/GU negative Renal ROS  negative genitourinary   Musculoskeletal   Abdominal   Peds  Hematology negative hematology ROS (+)   Anesthesia Other Findings Past Medical History: No date: CAD (coronary artery disease)     Comment: S/P STENT TO CIRCUMFLEX IN 2003, DES RCA 2009 No date: Elevated lipids No date: Gout No date: Gout No date: Hyperlipidemia No date: Hypertension   Reproductive/Obstetrics negative OB ROS                             Anesthesia Physical Anesthesia Plan  ASA: II  Anesthesia Plan: General   Post-op Pain Management:    Induction:   Airway Management Planned:   Additional Equipment:   Intra-op Plan:   Post-operative Plan:   Informed Consent: I have reviewed the patients History and Physical, chart, labs and discussed the procedure including the risks, benefits and alternatives for the proposed  anesthesia with the patient or authorized representative who has indicated his/her understanding and acceptance.   Dental Advisory Given  Plan Discussed with: Anesthesiologist, CRNA and Surgeon  Anesthesia Plan Comments:         Anesthesia Quick Evaluation

## 2016-05-07 ENCOUNTER — Encounter: Payer: Self-pay | Admitting: Gastroenterology

## 2016-05-08 LAB — SURGICAL PATHOLOGY

## 2016-11-21 ENCOUNTER — Other Ambulatory Visit: Payer: Self-pay | Admitting: Interventional Cardiology

## 2016-12-12 NOTE — Progress Notes (Signed)
Patient ID: Benjamin Wheeler, male   DOB: [birth year], 61 y.o.   MRN: [id number]     Cardiology Office Note   Date:  12/13/2016   ID:  Benjamin Wheeler, DOB 01/14/1956, MRN [id number]  PCP:  [name], MD    No chief complaint on file. f/u CAD   Wt Readings from Last 3 Encounters:  12/13/16 185 lb 6.4 oz (84.1 kg)  05/04/16 182 lb (82.6 kg)  11/23/15 188 lb (85.3 kg)       History of Present Illness: Benjamin Wheeler is a 61 y.o. male  who has CAD, most recent stent in 12/09. He has not been walking as much as he should. He has been working a lot at home and at his job; and is tired because he is on his feet all day.    Denies : Chest pain. Diaphoresis. Dizziness. Dyspnea on exertion. Leg edema. Nitroglycerin. Orthopnea.  Palpitations. Paroxysmal nocturnal dyspnea. Syncope. No internal bleeding problems.  He does bruise easily.  No further sx of dizziness.  Overall, he feels well. No sx like prior to his stent.    Walking up stairs carrying several gallons of liquid will cause some mild SHOB.  No problems on flat ground.  He thinks this is aging.    Past Medical History:  Diagnosis Date  . CAD (coronary artery disease)    S/P STENT TO CIRCUMFLEX IN 2003, DES RCA 2009  . Elevated lipids   . Gout   . Gout   . Hyperlipidemia   . Hypertension     Past Surgical History:  Procedure Laterality Date  . CARDIAC CATHETERIZATION     S/P STENT TO CIRCUMFLEX IN 2003, DES RCA 2009  . COLONOSCOPY WITH PROPOFOL N/A 05/04/2016   Procedure: COLONOSCOPY WITH PROPOFOL;  Surgeon: [name], MD;  Location: [facility] ENDOSCOPY;  Service: Endoscopy;  Laterality: N/A;  . heart stent       Current Outpatient Prescriptions  Medication Sig Dispense Refill  . aspirin 81 MG chewable tablet Chew by mouth daily.    Marland Kitchen atorvastatin (LIPITOR) 40 MG tablet TAKE 1 TABLET (40 MG TOTAL) BY MOUTH DAILY. 90 tablet 0  . b complex vitamins capsule Take 1 capsule by mouth daily.    .  Cholecalciferol 1000 UNIT/10ML LIQD Take 1,000 Units by mouth.    . clopidogrel (PLAVIX) 75 MG tablet TAKE 1 TABLET (75 MG TOTAL) BY MOUTH DAILY WITH BREAKFAST. 90 tablet 0  . Omega-3 Fatty Acids (FISH OIL) 1000 MG CAPS Take 3,000 mg by mouth 2 (two) times daily.     No current facility-administered medications for this visit.     Allergies:   Patient has no known allergies.    Social History:  The patient  reports that he has never smoked. He has never used smokeless tobacco. He reports that he does not drink alcohol or use drugs.   Family History:  The patient's family history includes CAD in his father; Hypertension in his father and mother.    ROS:  Please see the history of present illness.   Otherwise, review of systems are positive for right knee swelling- received cortisone shot.   All other systems are reviewed and negative.    PHYSICAL EXAM: VS:  BP 134/88   Pulse (!) 56   Ht 5\' 7"  (1.702 m)   Wt 185 lb 6.4 oz (84.1 kg)   BMI 29.04 kg/m  , BMI Body mass index is 29.04 kg/m. GEN: Well nourished,  well developed, in no acute distress  HEENT: normal  Neck: no JVD, carotid bruits, or masses Cardiac: RRR; no murmurs, rubs, or gallops,no edema  Respiratory:  clear to auscultation bilaterally, normal work of breathing GI: soft, nontender, nondistended, + BS MS: no deformity or atrophy  Skin: warm and dry, no rash Neuro:  Strength and sensation are intact Psych: euthymic mood, full affect   EKG:   The ekg ordered today demonstrates sinus bradycardia, no ST segment changes   Recent Labs: No results found for requested labs within last 8760 hours.   Lipid Panel    Component Value Date/Time   CHOL 111 (L) 11/23/2015 1003   CHOL 101 12/14/2014 1507   TRIG 87 11/23/2015 1003   TRIG 77 12/14/2014 1507   HDL 33 (L) 11/23/2015 1003   HDL 34 (L) 12/14/2014 1507   CHOLHDL 3.4 11/23/2015 1003   VLDL 17 11/23/2015 1003   LDLCALC 61 11/23/2015 1003   LDLCALC 52 12/14/2014  1507     Other studies Reviewed: Additional studies/ records that were reviewed today with results demonstrating: Cath results reviewed.   ASSESSMENT AND PLAN:  1. CAD: No angina.  It has been many years since his RCA stent.  Negative ETT in 3/17.  Continue clopidogrel.  He can stop aspirin. THis may help with decreasing nuisance bleeding. 2. Hyperlipidemia: Continue atorvastatin and fish oil.  Will check labs today.  Lipids controlled in 2017. 3. High BP reading without HTN: Diastolic mildly elevated- this has been present for several years.  Try to increase exercise.  No need for meds at this time. I have asked him to check his BP outside of the doctors office as well. To keep an eye on this.  4. Mild DOE appears to be reasonable for his age.  COntinue regular exercise.   If he has more sx, would reconsider eval.  He had a recent negative stres stest.    Current medicines are reviewed at length with the patient today.  The patient concerns regarding his medicines were addressed.  The following changes have been made:  Stop aspirin  Labs/ tests ordered today include:  No orders of the defined types were placed in this encounter.   Recommend 150 minutes/week of aerobic exercise Low fat, low carb, high fiber diet recommended  Disposition:   FU in 1 year   Signed, Larae Grooms, MD  12/13/2016 8:43 AM    [facility] [place], [place], [place]  [address] Phone: [phone]; Fax: [phone]

## 2016-12-13 ENCOUNTER — Encounter: Payer: Self-pay | Admitting: Interventional Cardiology

## 2016-12-13 ENCOUNTER — Ambulatory Visit (INDEPENDENT_AMBULATORY_CARE_PROVIDER_SITE_OTHER): Payer: BLUE CROSS/BLUE SHIELD | Admitting: Interventional Cardiology

## 2016-12-13 VITALS — BP 134/88 | HR 56 | Ht 67.0 in | Wt 185.4 lb

## 2016-12-13 DIAGNOSIS — R0609 Other forms of dyspnea: Secondary | ICD-10-CM | POA: Diagnosis not present

## 2016-12-13 DIAGNOSIS — I2510 Atherosclerotic heart disease of native coronary artery without angina pectoris: Secondary | ICD-10-CM

## 2016-12-13 DIAGNOSIS — E782 Mixed hyperlipidemia: Secondary | ICD-10-CM | POA: Diagnosis not present

## 2016-12-13 DIAGNOSIS — R030 Elevated blood-pressure reading, without diagnosis of hypertension: Secondary | ICD-10-CM | POA: Diagnosis not present

## 2016-12-13 LAB — COMPREHENSIVE METABOLIC PANEL
A/G RATIO: 2.3 — AB (ref 1.2–2.2)
ALT: 60 IU/L — AB (ref 0–44)
AST: 36 IU/L (ref 0–40)
Albumin: 4.6 g/dL (ref 3.6–4.8)
Alkaline Phosphatase: 81 IU/L (ref 39–117)
BUN/Creatinine Ratio: 15 (ref 10–24)
BUN: 17 mg/dL (ref 8–27)
Bilirubin Total: 0.9 mg/dL (ref 0.0–1.2)
CALCIUM: 9.1 mg/dL (ref 8.6–10.2)
CO2: 23 mmol/L (ref 18–29)
CREATININE: 1.12 mg/dL (ref 0.76–1.27)
Chloride: 103 mmol/L (ref 96–106)
GFR, EST AFRICAN AMERICAN: 82 mL/min/{1.73_m2} (ref >59)
GFR, EST NON AFRICAN AMERICAN: 71 mL/min/{1.73_m2} (ref >59)
Globulin, Total: 2 g/dL (ref 1.5–4.5)
Glucose: 130 mg/dL — ABNORMAL HIGH (ref 65–99)
POTASSIUM: 5 mmol/L (ref 3.5–5.2)
Sodium: 141 mmol/L (ref 134–144)
TOTAL PROTEIN: 6.6 g/dL (ref 6.0–8.5)

## 2016-12-13 LAB — LIPID PANEL
CHOL/HDL RATIO: 3.3 ratio (ref 0.0–5.0)
Cholesterol, Total: 102 mg/dL (ref 100–199)
HDL: 31 mg/dL — AB (ref >39)
LDL CALC: 53 mg/dL (ref 0–99)
Triglycerides: 89 mg/dL (ref 0–149)
VLDL CHOLESTEROL CAL: 18 mg/dL (ref 5–40)

## 2016-12-13 MED ORDER — CLOPIDOGREL BISULFATE 75 MG PO TABS: ORAL_TABLET | ORAL | 0 refills | Status: DC

## 2016-12-13 MED ORDER — ATORVASTATIN CALCIUM 40 MG PO TABS: 40.0000 mg | ORAL_TABLET | Freq: Every day | ORAL | 0 refills | Status: DC

## 2016-12-13 NOTE — Patient Instructions (Addendum)
Medication Instructions:  STOP taking Aspirin.   Labwork: LABS TODAY: CMET, FASTING LIPIDS  Testing/Procedures: None ordered  Follow-Up: Your physician wants you to follow-up in: 1 year with Dr. Irish Lack. You will receive a reminder letter in the mail two months in advance. If you don't receive a letter, please call our office to schedule the follow-up appointment.   Any Other Special Instructions Will Be Listed Below (If Applicable).     If you need a refill on your cardiac medications before your next appointment, please call your pharmacy.

## 2017-02-18 ENCOUNTER — Other Ambulatory Visit: Payer: Self-pay | Admitting: Interventional Cardiology

## 2017-12-20 ENCOUNTER — Encounter: Payer: Self-pay | Admitting: Interventional Cardiology

## 2017-12-30 NOTE — Progress Notes (Signed)
Cardiology Office Note   Date:  12/31/2017   ID:  Benjamin Wheeler, DOB 11-06-1955, MRN [id number]  PCP:  [name], MD    No chief complaint on file.  CAD  Wt Readings from Last 3 Encounters:  12/31/17 189 lb 3.2 oz (85.8 kg)  12/13/16 185 lb 6.4 oz (84.1 kg)  05/04/16 182 lb (82.6 kg)       History of Present Illness: Benjamin Wheeler is a 62 y.o. male  who has CAD, most recent stent in 12/09. It was to the RCA.    He had a fall in 2/19 and tore a quadriceps muscle.  He was treated with PT.  Denies : Chest pain. Dizziness. Leg edema. Nitroglycerin use. Orthopnea. Palpitations. Paroxysmal nocturnal dyspnea. Shortness of breath. Syncope.   Walking has been limited by the injury above.    He tries to eat healthy.   No sx like he had before the stent.      Past Medical History:  Diagnosis Date  . CAD (coronary artery disease)    S/P STENT TO CIRCUMFLEX IN 2003, DES RCA 2009  . Elevated lipids   . Gout   . Gout   . Hyperlipidemia   . Hypertension     Past Surgical History:  Procedure Laterality Date  . CARDIAC CATHETERIZATION     S/P STENT TO CIRCUMFLEX IN 2003, DES RCA 2009  . COLONOSCOPY WITH PROPOFOL N/A 05/04/2016   Procedure: COLONOSCOPY WITH PROPOFOL;  Surgeon: [name], MD;  Location: [facility] ENDOSCOPY;  Service: Endoscopy;  Laterality: N/A;  . heart stent       Current Outpatient Medications  Medication Sig Dispense Refill  . aspirin EC 81 MG tablet Take 81 mg by mouth daily.    Marland Kitchen atorvastatin (LIPITOR) 40 MG tablet TAKE 1 TABLET (40 MG TOTAL) BY MOUTH DAILY. 90 tablet 3  . b complex vitamins capsule Take 1 capsule by mouth daily.    . Cholecalciferol 1000 UNIT/10ML LIQD Take 1,000 Units by mouth daily.     . clopidogrel (PLAVIX) 75 MG tablet TAKE 1 TABLET (75 MG TOTAL) BY MOUTH DAILY WITH BREAKFAST. 90 tablet 3  . Omega-3 Fatty Acids (FISH OIL) 1000 MG CAPS Take 3,000 mg by mouth 2 (two) times daily.     No current  facility-administered medications for this visit.     Allergies:   Patient has no known allergies.    Social History:  The patient  reports that he has never smoked. He has never used smokeless tobacco. He reports that he does not drink alcohol or use drugs.   Family History:  The patient's family history includes CAD in his father; Hypertension in his father and mother.    ROS:  Please see the history of present illness.   Otherwise, review of systems are positive for fall as noted above.   All other systems are reviewed and negative.    PHYSICAL EXAM: VS:  BP 118/82   Pulse (!) 57   Ht 5\' 7"  (1.702 m)   Wt 189 lb 3.2 oz (85.8 kg)   SpO2 96%   BMI 29.63 kg/m  , BMI Body mass index is 29.63 kg/m. GEN: Well nourished, well developed, in no acute distress  HEENT: normal  Neck: no JVD, carotid bruits, or masses Cardiac: RRR; no murmurs, rubs, or gallops,; trace bilateral leg edema  Respiratory:  clear to auscultation bilaterally, normal work of breathing GI: soft, nontender, nondistended, + BS MS: no  deformity or atrophy  Skin: warm and dry, no rash Neuro:  Strength and sensation are intact Psych: euthymic mood, full affect   EKG:   The ekg ordered today demonstrates Normal ECG   Recent Labs: No results found for requested labs within last 8760 hours.   Lipid Panel    Component Value Date/Time   CHOL 102 12/13/2016 0859   CHOL 101 12/14/2014 1507   TRIG 89 12/13/2016 0859   TRIG 77 12/14/2014 1507   HDL 31 (L) 12/13/2016 0859   HDL 34 (L) 12/14/2014 1507   CHOLHDL 3.3 12/13/2016 0859   CHOLHDL 3.4 11/23/2015 1003   VLDL 17 11/23/2015 1003   LDLCALC 53 12/13/2016 0859   LDLCALC 52 12/14/2014 1507     Other studies Reviewed: Additional studies/ records that were reviewed today with results demonstrating: Prior labs reviewed as noted above.   ASSESSMENT AND PLAN:  1. CAD: Negative ETT in 3/17.  Prior RCA stent in 2009.  No angina on medical therapy.  Try to  get back to 150 minutes of exercise per week as noted below, to maintain stamina. 2. Hyperlipidemia: Blood work was done [employer] in [place].  We will obtain these results.  LDL target should be less in the 100.  Continue atorvastatin. 3. Elevated BP:  He has not checked at home recently.  Well controlled today.  I recommend that he check his BP at the drug store at least once a month.  If readings are more than 140/90, he should let us know.    Current medicines are reviewed at length with the patient today.  The patient concerns regarding his medicines were addressed.  The following changes have been made:  No change  Labs/ tests ordered today include:  No orders of the defined types were placed in this encounter.   Recommend 150 minutes/week of aerobic exercise Low fat, low carb, high fiber diet recommended  Disposition:   FU in 1 year   Signed, Larae Grooms, MD  12/31/2017 8:16 AM    [facility] [place], [place], [place]  [address] Phone: [phone]; Fax: [phone]

## 2017-12-31 ENCOUNTER — Encounter: Payer: Self-pay | Admitting: Interventional Cardiology

## 2017-12-31 ENCOUNTER — Ambulatory Visit: Payer: BLUE CROSS/BLUE SHIELD | Admitting: Interventional Cardiology

## 2017-12-31 ENCOUNTER — Other Ambulatory Visit: Payer: Self-pay

## 2017-12-31 VITALS — BP 118/82 | HR 57 | Ht 67.0 in | Wt 189.2 lb

## 2017-12-31 DIAGNOSIS — R030 Elevated blood-pressure reading, without diagnosis of hypertension: Secondary | ICD-10-CM

## 2017-12-31 DIAGNOSIS — E782 Mixed hyperlipidemia: Secondary | ICD-10-CM

## 2017-12-31 DIAGNOSIS — I2510 Atherosclerotic heart disease of native coronary artery without angina pectoris: Secondary | ICD-10-CM | POA: Diagnosis not present

## 2017-12-31 NOTE — Patient Instructions (Signed)

## 2018-04-16 ENCOUNTER — Other Ambulatory Visit: Payer: Self-pay | Admitting: Interventional Cardiology

## 2018-12-06 ENCOUNTER — Other Ambulatory Visit: Payer: Self-pay | Admitting: Interventional Cardiology

## 2019-01-06 ENCOUNTER — Other Ambulatory Visit: Payer: Self-pay | Admitting: Interventional Cardiology

## 2019-01-27 ENCOUNTER — Telehealth: Payer: Self-pay

## 2019-01-27 NOTE — Telephone Encounter (Signed)
Called pt to set up possible evisit. Left message asking to call the office.

## 2019-01-27 NOTE — Telephone Encounter (Signed)
° °  Patient declined virtual visit. Scheduled in office visit for August

## 2019-01-29 ENCOUNTER — Ambulatory Visit: Payer: BLUE CROSS/BLUE SHIELD | Admitting: Interventional Cardiology

## 2019-04-03 ENCOUNTER — Other Ambulatory Visit: Payer: Self-pay | Admitting: Interventional Cardiology

## 2019-05-31 NOTE — Progress Notes (Signed)
Cardiology Office Note   Date:  06/02/2019   ID:  Benjamin Wheeler 11/24/1955, MRN [id number]  PCP:  [name], MD    No chief complaint on file.  CAD  Wt Readings from Last 3 Encounters:  06/02/19 189 lb 12.8 oz (86.1 kg)  12/31/17 189 lb 3.2 oz (85.8 kg)  12/13/16 185 lb 6.4 oz (84.1 kg)       History of Present Illness: Benjamin Wheeler is a 63 y.o. male who has CAD, most recent stent in 12/09. It was to the RCA.    He had a fall in 2/19 and tore a quadriceps muscle.  He was treated with PT.  He was planning to increase exercise at the time of his last visit.   Denies : Chest pain. Dizziness. Leg edema. Nitroglycerin use. Orthopnea. Palpitations. Paroxysmal nocturnal dyspnea. Shortness of breath. Syncope.   Exercise limited by quad weakness.  He continues to try to walk on regular basis.  Works in the yard.    He stays away from crowds and wears a mask.  He still works at an [employer] place.     Past Medical History:  Diagnosis Date  . CAD (coronary artery disease)    S/P STENT TO CIRCUMFLEX IN 2003, DES RCA 2009  . Elevated lipids   . Gout   . Gout   . Hyperlipidemia   . Hypertension     Past Surgical History:  Procedure Laterality Date  . CARDIAC CATHETERIZATION     S/P STENT TO CIRCUMFLEX IN 2003, DES RCA 2009  . COLONOSCOPY WITH PROPOFOL N/A 05/04/2016   Procedure: COLONOSCOPY WITH PROPOFOL;  Surgeon: Benjamin Sails, MD;  Location: [facility] ENDOSCOPY;  Service: Endoscopy;  Laterality: N/A;  . heart stent       Current Outpatient Medications  Medication Sig Dispense Refill  . aspirin EC 81 MG tablet Take 81 mg by mouth daily.    Marland Kitchen atorvastatin (LIPITOR) 40 MG tablet TAKE 1 TABLET BY MOUTH EVERY DAY 90 tablet 3  . b complex vitamins capsule Take 1 capsule by mouth daily.    . Cholecalciferol 1000 UNIT/10ML LIQD Take 1,000 Units by mouth daily.     . clopidogrel (PLAVIX) 75 MG tablet TAKE 1 TABLET (75 MG TOTAL) BY MOUTH DAILY WITH  BREAKFAST. 90 tablet 2  . Omega-3 Fatty Acids (FISH OIL) 1000 MG CAPS Take 3,000 mg by mouth 2 (two) times daily.     No current facility-administered medications for this visit.     Allergies:   Patient has no known allergies.    Social History:  The patient  reports that he has never smoked. He has never used smokeless tobacco. He reports that he does not drink alcohol or use drugs.   Family History:  The patient's family history includes CAD in his father; Hypertension in his father and mother.    ROS:  Please see the history of present illness.   Otherwise, review of systems are positive for right quadriceps weakness.  Joint pains in shoulder, elbow and knees. All other systems are reviewed and negative.    PHYSICAL EXAM: VS:  BP 124/82   Pulse 61   Ht 5\' 7"  (1.702 m)   Wt 189 lb 12.8 oz (86.1 kg)   SpO2 95%   BMI 29.73 kg/m  , BMI Body mass index is 29.73 kg/m. GEN: Well nourished, well developed, in no acute distress  HEENT: normal  Neck: no JVD, carotid bruits,  or masses Cardiac: RRR; no murmurs, rubs, or gallops,no edema  Respiratory:  clear to auscultation bilaterally, normal work of breathing GI: soft, nontender, nondistended, + BS MS: no deformity or atrophy , 2+ PT pulses bilaterally Skin: warm and dry, no rash Neuro:  Strength and sensation are intact Psych: euthymic mood, full affect   EKG:   The ekg ordered today demonstrates normal ECG   Recent Labs: No results found for requested labs within last 8760 hours.   Lipid Panel    Component Value Date/Time   CHOL 102 12/13/2016 0859   CHOL 101 12/14/2014 1507   TRIG 89 12/13/2016 0859   TRIG 77 12/14/2014 1507   HDL 31 (L) 12/13/2016 0859   HDL 34 (L) 12/14/2014 1507   CHOLHDL 3.3 12/13/2016 0859   CHOLHDL 3.4 11/23/2015 1003   VLDL 17 11/23/2015 1003   LDLCALC 53 12/13/2016 0859   LDLCALC 52 12/14/2014 1507     Other studies Reviewed: Additional studies/ records that were reviewed today with  results demonstrating: 2018 labs.   ASSESSMENT AND PLAN:  1. CAD: Negative ETT in 3/17.  RCA stent in 2009. No angina.  COntinue aggressive secondary prevention.  Could stop aspirin and continue Plavix monotherapy.  He prefers to stay on both.  He will let us know if he has recurrent sx.  2. Hyperlipidemia: Check lipids today.  Last check was 2018.  COntinue statin.  3. Elevated BP: Controlled today. COntinue to try to increase exercise and healthy diet.    Current medicines are reviewed at length with the patient today.  The patient concerns regarding his medicines were addressed.  The following changes have been made:  No change  Labs/ tests ordered today include: CMet and lipids No orders of the defined types were placed in this encounter.   Recommend 150 minutes/week of aerobic exercise Low fat, low carb, high fiber diet recommended  Disposition:   FU in 1 year   Signed, Benjamin Grooms, MD  06/02/2019 9:04 AM    [facility] [place], [place],   [address] Phone: [phone]; Fax: [phone]

## 2019-06-02 ENCOUNTER — Encounter: Payer: Self-pay | Admitting: Interventional Cardiology

## 2019-06-02 ENCOUNTER — Other Ambulatory Visit: Payer: Self-pay

## 2019-06-02 ENCOUNTER — Ambulatory Visit: Payer: BC Managed Care – PPO | Admitting: Interventional Cardiology

## 2019-06-02 VITALS — BP 124/82 | HR 61 | Ht 67.0 in | Wt 189.8 lb

## 2019-06-02 DIAGNOSIS — I2510 Atherosclerotic heart disease of native coronary artery without angina pectoris: Secondary | ICD-10-CM

## 2019-06-02 DIAGNOSIS — R030 Elevated blood-pressure reading, without diagnosis of hypertension: Secondary | ICD-10-CM | POA: Diagnosis not present

## 2019-06-02 DIAGNOSIS — E782 Mixed hyperlipidemia: Secondary | ICD-10-CM | POA: Diagnosis not present

## 2019-06-02 LAB — COMPREHENSIVE METABOLIC PANEL
ALT: 34 IU/L (ref 0–44)
AST: 23 IU/L (ref 0–40)
Albumin/Globulin Ratio: 2.5 — ABNORMAL HIGH (ref 1.2–2.2)
Albumin: 4.7 g/dL (ref 3.8–4.8)
Alkaline Phosphatase: 73 IU/L (ref 39–117)
BUN/Creatinine Ratio: 14 (ref 10–24)
BUN: 15 mg/dL (ref 8–27)
Bilirubin Total: 1 mg/dL (ref 0.0–1.2)
CO2: 21 mmol/L (ref 20–29)
Calcium: 9.6 mg/dL (ref 8.6–10.2)
Chloride: 105 mmol/L (ref 96–106)
Creatinine, Ser: 1.07 mg/dL (ref 0.76–1.27)
GFR calc Af Amer: 85 mL/min/{1.73_m2} (ref >59)
GFR calc non Af Amer: 73 mL/min/{1.73_m2} (ref >59)
Globulin, Total: 1.9 g/dL (ref 1.5–4.5)
Glucose: 155 mg/dL — ABNORMAL HIGH (ref 65–99)
Potassium: 4.8 mmol/L (ref 3.5–5.2)
Sodium: 144 mmol/L (ref 134–144)
Total Protein: 6.6 g/dL (ref 6.0–8.5)

## 2019-06-02 LAB — CBC
Hematocrit: 42.4 % (ref 37.5–51.0)
Hemoglobin: 14.8 g/dL (ref 13.0–17.7)
MCH: 31.1 pg (ref 26.6–33.0)
MCHC: 34.9 g/dL (ref 31.5–35.7)
MCV: 89 fL (ref 79–97)
Platelets: 194 10*3/uL (ref 150–450)
RBC: 4.76 x10E6/uL (ref 4.14–5.80)
RDW: 12 % (ref 11.6–15.4)
WBC: 8 10*3/uL (ref 3.4–10.8)

## 2019-06-02 LAB — LIPID PANEL
Chol/HDL Ratio: 3.5 ratio (ref 0.0–5.0)
Cholesterol, Total: 128 mg/dL (ref 100–199)
HDL: 37 mg/dL — ABNORMAL LOW (ref >39)
LDL Calculated: 68 mg/dL (ref 0–99)
Triglycerides: 114 mg/dL (ref 0–149)
VLDL Cholesterol Cal: 23 mg/dL (ref 5–40)

## 2019-06-02 MED ORDER — ATORVASTATIN CALCIUM 40 MG PO TABS: 40.0000 mg | ORAL_TABLET | Freq: Every day | ORAL | 3 refills | Status: DC

## 2019-06-02 MED ORDER — CLOPIDOGREL BISULFATE 75 MG PO TABS: ORAL_TABLET | ORAL | 3 refills | Status: DC

## 2019-06-02 NOTE — Patient Instructions (Signed)
Medication Instructions:  Your physician recommends that you continue on your current medications as directed. Please refer to the Current Medication list given to you today.  If you need a refill on your cardiac medications before your next appointment, please call your pharmacy.   Lab work: Your physician recommends that you have lab work today: CBC CMET LIPIDS  If you have labs (blood work) drawn today and your tests are completely normal, you will receive your results only by: Marland Kitchen MyChart Message (if you have MyChart) OR . A paper copy in the mail If you have any lab test that is abnormal or we need to change your treatment, we will call you to review the results.  Testing/Procedures: NONE  Follow-Up: At [facility], you and your health needs are our priority.  As part of our continuing mission to provide you with exceptional heart care, we have created designated Provider Care Teams.  These Care Teams include your primary Cardiologist (physician) and Advanced Practice Providers (APPs -  Physician Assistants and Nurse Practitioners) who all work together to provide you with the care you need, when you need it. You will need a follow up appointment in 12 months.  Please call our office 2 months in advance to schedule this appointment.  You may see Dr. Irish Lack or one of the following Advanced Practice Providers on your designated Care Team:   Candelaria Arenas, PA-C Melina Copa, PA-C . Ermalinda Barrios, PA-C  Any Other Special Instructions Will Be Listed Below (If Applicable).

## 2020-06-10 ENCOUNTER — Telehealth: Payer: Self-pay | Admitting: Interventional Cardiology

## 2020-06-10 NOTE — Telephone Encounter (Signed)
Patient's wife stated she was returning a call. I did not see any notes.

## 2020-07-20 ENCOUNTER — Other Ambulatory Visit: Payer: Self-pay | Admitting: Interventional Cardiology

## 2020-07-21 ENCOUNTER — Other Ambulatory Visit: Payer: Self-pay | Admitting: Interventional Cardiology

## 2020-09-05 ENCOUNTER — Telehealth: Payer: Self-pay | Admitting: Interventional Cardiology

## 2020-09-05 NOTE — Telephone Encounter (Signed)
Attempted to contact patient but there was no answer and VM did not pick up. 

## 2020-09-05 NOTE — Telephone Encounter (Signed)
Pt c/o Shortness Of Breath: STAT if SOB developed within the last 24 hours or pt is noticeably SOB on the phone  1. Are you currently SOB (can you hear that pt is SOB on the phone)? No   2. How long have you been experiencing SOB? Past week or two   3. Are you SOB when sitting or when up moving around? Upon exertion   4. Are you currently experiencing any other symptoms? Hypertension ranging 150's-160's/88-102  Benjamin Wheeler is requesting an appointment and some test be ran in regards to this. Please advise.

## 2020-09-07 NOTE — Telephone Encounter (Signed)
Patient returning Brittany's call. States if he does not answer first call on cell to please immediately call again.

## 2020-09-08 NOTE — Telephone Encounter (Signed)
Called and spoke to patient. He states that he has had increased SOB on exertion. Denies chest pain, swelling, weight gain, or any other Sx. He states that his BP has been a little elevated at 145/95, 155/88, 162/92. Patient is not on any BP meds or diuretics. He is overdue for follow-up. I have scheduled him to see Ermalinda Barrios, PA at the [facility] office on 12/7.

## 2020-09-12 NOTE — Progress Notes (Signed)
Cardiology Office Note    Date:  09/13/2020   ID:  Benjamin Wheeler, DOB Sep 13, 1956, MRN [id number]  PCP:  Benjamin Pink, MD  Cardiologist: Larae Grooms, MD EPS: None  Chief Complaint  Patient presents with  . Shortness of Breath    History of Present Illness:  Benjamin Wheeler is a 64 y.o. male with history of CAD status post stent to the circumflex in 2003, DES to the RCA 2009, hyperlipidemia, hypertension managed with diet and exercise in the past.  Last saw Dr. Irish Lack 06/02/19 and BP high but patient wanted to control with diet and exercise.  Patient called in 09/05/2020 complaining of dyspnea on exertion and elevated blood pressures.Says he is short of breath going up stairs carrying things or when he's in a hurry. Has been going on for a month. No associated chest tightness like when he had his stents.  BP running 149/82 this am, 155/88, 162/92. 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 Tries to watch his salt but eats out a lot. Patient thought he gained about 10 lbs but on our scales he's the same.    Past Medical History:  Diagnosis Date  . CAD (coronary artery disease)    S/P STENT TO CIRCUMFLEX IN 2003, DES RCA 2009  . Elevated lipids   . Gout   . Gout   . Hyperlipidemia   . Hypertension     Past Surgical History:  Procedure Laterality Date  . CARDIAC CATHETERIZATION     S/P STENT TO CIRCUMFLEX IN 2003, DES RCA 2009  . COLONOSCOPY WITH PROPOFOL N/A 05/04/2016   Procedure: COLONOSCOPY WITH PROPOFOL;  Surgeon: [name], MD;  Location: [facility] ENDOSCOPY;  Service: Endoscopy;  Laterality: N/A;  . heart stent      Current Medications: Current Meds  Medication Sig  . aspirin EC 81 MG tablet Take 81 mg by mouth daily.  Marland Kitchen atorvastatin (LIPITOR) 40 MG tablet TAKE 1 TABLET BY MOUTH EVERY DAY  . b complex vitamins capsule Take 1 capsule by mouth daily.  . Cholecalciferol 1000 UNIT/10ML LIQD Take 1,000 Units by mouth daily.    . clopidogrel (PLAVIX) 75 MG tablet TAKE 1 TABLET (75 MG TOTAL) BY MOUTH DAILY WITH BREAKFAST.  Marland Kitchen Omega-3 Fatty Acids (FISH OIL) 1000 MG CAPS Take 3,000 mg by mouth 2 (two) times daily.     Allergies:   Patient has no known allergies.   Social History   Socioeconomic History  . Marital status: Married    Spouse name: Not on file  . Number of children: Not on file  . Years of education: Not on file  . Highest education level: Not on file  Occupational History  . Not on file  Tobacco Use  . Smoking status: Never Smoker  . Smokeless tobacco: Never Used  Vaping Use  . Vaping Use: Never used  Substance and Sexual Activity  . Alcohol use: No  . Drug use: No  . Sexual activity: Not on file  Other Topics Concern  . Not on file  Social History Narrative  . Not on file   Social Determinants of Health   Financial Resource Strain:   . Difficulty of Paying Living Expenses: Not on file  Food Insecurity:   . Worried About Charity fundraiser in the Last Year: Not on file  . Ran Out of Food in the Last Year: Not on file  Transportation Needs:   . Lack of Transportation (Medical):  Not on file  . Lack of Transportation (Non-Medical): Not on file  Physical Activity:   . Days of Exercise per Week: Not on file  . Minutes of Exercise per Session: Not on file  Stress:   . Feeling of Stress : Not on file  Social Connections:   . Frequency of Communication with Friends and Family: Not on file  . Frequency of Social Gatherings with Friends and Family: Not on file  . Attends Religious Services: Not on file  . Active Member of Clubs or Organizations: Not on file  . Attends Archivist Meetings: Not on file  . Marital Status: Not on file     Family History:  The patient's family history includes CAD in his father; Hypertension in his father and mother.   ROS:   Please see the history of present illness.    ROS All other systems reviewed and are negative.   PHYSICAL EXAM:    VS:  BP 138/82   Pulse (!) 102   Ht 5\' 7"  (1.702 m)   Wt 189 lb (85.7 kg)   SpO2 96%   BMI 29.60 kg/m   Physical Exam  GEN: Well nourished, well developed, in no acute distress  Neck: no JVD, carotid bruits, or masses Cardiac:RRR; no murmurs, rubs, or gallops  Respiratory:  clear to auscultation bilaterally, normal work of breathing GI: soft, nontender, nondistended, + BS Ext: without cyanosis, clubbing, or edema, Good distal pulses bilaterally Neuro:  Alert and Oriented x 3, Strength and sensation are intact Psych: euthymic mood, full affect  Wt Readings from Last 3 Encounters:  09/13/20 189 lb (85.7 kg)  06/02/19 189 lb 12.8 oz (86.1 kg)  12/31/17 189 lb 3.2 oz (85.8 kg)      Studies/Labs Reviewed:   EKG:  EKG is  ordered today.  The ekg ordered today demonstrates normal sinus rhythm, normal EKG  Recent Labs: No results found for requested labs within last 8760 hours.   Lipid Panel    Component Value Date/Time   CHOL 128 06/02/2019 0935   CHOL 101 12/14/2014 1507   TRIG 114 06/02/2019 0935   TRIG 77 12/14/2014 1507   HDL 37 (L) 06/02/2019 0935   HDL 34 (L) 12/14/2014 1507   CHOLHDL 3.5 06/02/2019 0935   CHOLHDL 3.4 11/23/2015 1003   VLDL 17 11/23/2015 1003   LDLCALC 68 06/02/2019 0935   LDLCALC 52 12/14/2014 1507    Additional studies/ records that were reviewed today include:      ASSESSMENT:    1. DOE (dyspnea on exertion)   2. Coronary artery disease involving native coronary artery of native heart without angina pectoris   3. Essential hypertension   4. Hyperlipidemia, unspecified hyperlipidemia type      PLAN:  In order of problems listed above:  Dyspnea on exertion going up stairs or when he is in a rush for over 1 month.  Blood pressure has been running high as well.  No chest pain.  Check GXT Myoview to rule out ischemia.  Patient's wife sees a cardiologist at the [facility] clinic and he is thinking about switching because it takes him an  hour to drive here.  I offered him follow-up with our cardiologist in [place].  He will decide on Thursday and call us back and let us know where he will follow-up.  CAD stent to the RCA 09/2008 no chest pain but recent dyspnea on exertion.  Exercise Myoview.  Hypertension managed with exercise and diet  in the past now running high.  Will begin amlodipine 5 mg once daily.  Hyperlipidemia needs fasting lipid panel and surveillance labs      Medication Adjustments/Labs and Tests Ordered: Current medicines are reviewed at length with the patient today.  Concerns regarding medicines are outlined above.  Medication changes, Labs and Tests ordered today are listed in the Patient Instructions below. There are no Patient Instructions on file for this visit.   Signed, Ermalinda Barrios, PA-C  09/13/2020 1:18 PM    [facility] [place], [place], [place]  [address] Phone: [phone]; Fax: [phone]

## 2020-09-13 ENCOUNTER — Other Ambulatory Visit: Payer: Self-pay

## 2020-09-13 ENCOUNTER — Telehealth: Payer: Self-pay | Admitting: Physician Assistant

## 2020-09-13 ENCOUNTER — Ambulatory Visit (INDEPENDENT_AMBULATORY_CARE_PROVIDER_SITE_OTHER): Payer: Medicare Other | Admitting: Physician Assistant

## 2020-09-13 ENCOUNTER — Encounter: Payer: Self-pay | Admitting: Physician Assistant

## 2020-09-13 VITALS — BP 138/82 | HR 102 | Ht 67.0 in | Wt 189.0 lb

## 2020-09-13 DIAGNOSIS — R0609 Other forms of dyspnea: Secondary | ICD-10-CM

## 2020-09-13 DIAGNOSIS — I2510 Atherosclerotic heart disease of native coronary artery without angina pectoris: Secondary | ICD-10-CM | POA: Diagnosis not present

## 2020-09-13 DIAGNOSIS — R0600 Dyspnea, unspecified: Secondary | ICD-10-CM

## 2020-09-13 DIAGNOSIS — E785 Hyperlipidemia, unspecified: Secondary | ICD-10-CM | POA: Diagnosis not present

## 2020-09-13 DIAGNOSIS — I10 Essential (primary) hypertension: Secondary | ICD-10-CM

## 2020-09-13 MED ORDER — AMLODIPINE BESYLATE 5 MG PO TABS: 5.0000 mg | ORAL_TABLET | Freq: Every day | ORAL | 3 refills | Status: AC

## 2020-09-13 NOTE — Telephone Encounter (Signed)
Benjamin Wheeler is calling to reschedule his excersie Stress test. Please advise.

## 2020-09-13 NOTE — Patient Instructions (Signed)
Medication Instructions:  Your physician has recommended you make the following change in your medication:   START: Amlodipine 5mg  daily  *If you need a refill on your cardiac medications before your next appointment, please call your pharmacy*   Lab Work: TODAY: CMET, CBC, FLP  If you have labs (blood work) drawn today and your tests are completely normal, you will receive your results only by: Marland Kitchen MyChart Message (if you have MyChart) OR . A paper copy in the mail If you have any lab test that is abnormal or we need to change your treatment, we will call you to review the results.   Testing/Procedures: Your physician has requested that you have en exercise stress myoview. For further information please visit [email or web address]. Please follow instruction sheet, as given.  Follow-Up: At [facility], you and your health needs are our priority.  As part of our continuing mission to provide you with exceptional heart care, we have created designated Provider Care Teams.  These Care Teams include your primary Cardiologist (physician) and Advanced Practice Providers (APPs -  Physician Assistants and Nurse Practitioners) who all work together to provide you with the care you need, when you need it.  We recommend signing up for the patient portal called "MyChart".  Sign up information is provided on this After Visit Summary.  MyChart is used to connect with patients for Virtual Visits (Telemedicine).  Patients are able to view lab/test results, encounter notes, upcoming appointments, etc.  Non-urgent messages can be sent to your provider as well.   To learn more about what you can do with MyChart, go to [email or web address].    Your next appointment:   Call us if you do not get set up with a new Cardiologist and need a follow up appointment

## 2020-09-14 LAB — COMPREHENSIVE METABOLIC PANEL
ALT: 55 IU/L — ABNORMAL HIGH (ref 0–44)
AST: 39 IU/L (ref 0–40)
Albumin/Globulin Ratio: 2.6 — ABNORMAL HIGH (ref 1.2–2.2)
Albumin: 4.9 g/dL — ABNORMAL HIGH (ref 3.8–4.8)
Alkaline Phosphatase: 83 IU/L (ref 44–121)
BUN/Creatinine Ratio: 16 (ref 10–24)
BUN: 14 mg/dL (ref 8–27)
Bilirubin Total: 1.2 mg/dL (ref 0.0–1.2)
CO2: 25 mmol/L (ref 20–29)
Calcium: 9.8 mg/dL (ref 8.6–10.2)
Chloride: 105 mmol/L (ref 96–106)
Creatinine, Ser: 0.89 mg/dL (ref 0.76–1.27)
GFR calc Af Amer: 104 mL/min/{1.73_m2} (ref >59)
GFR calc non Af Amer: 90 mL/min/{1.73_m2} (ref >59)
Globulin, Total: 1.9 g/dL (ref 1.5–4.5)
Glucose: 134 mg/dL — ABNORMAL HIGH (ref 65–99)
Potassium: 4.8 mmol/L (ref 3.5–5.2)
Sodium: 143 mmol/L (ref 134–144)
Total Protein: 6.8 g/dL (ref 6.0–8.5)

## 2020-09-14 LAB — CBC
Hematocrit: 43.6 % (ref 37.5–51.0)
Hemoglobin: 15.2 g/dL (ref 13.0–17.7)
MCH: 31.1 pg (ref 26.6–33.0)
MCHC: 34.9 g/dL (ref 31.5–35.7)
MCV: 89 fL (ref 79–97)
Platelets: 181 10*3/uL (ref 150–450)
RBC: 4.89 x10E6/uL (ref 4.14–5.80)
RDW: 12.2 % (ref 11.6–15.4)
WBC: 8.7 10*3/uL (ref 3.4–10.8)

## 2020-09-14 LAB — LIPID PANEL
Chol/HDL Ratio: 3.3 ratio (ref 0.0–5.0)
Cholesterol, Total: 109 mg/dL (ref 100–199)
HDL: 33 mg/dL — ABNORMAL LOW (ref >39)
LDL Chol Calc (NIH): 57 mg/dL (ref 0–99)
Triglycerides: 98 mg/dL (ref 0–149)
VLDL Cholesterol Cal: 19 mg/dL (ref 5–40)

## 2020-09-14 NOTE — Telephone Encounter (Signed)
Lexiscan scheduled for 09/20/2020.Marland Kitchen Pt aware

## 2020-09-14 NOTE — Telephone Encounter (Signed)
Please schedule lexiscan. thanks

## 2020-09-15 ENCOUNTER — Telehealth: Payer: Self-pay | Admitting: *Deleted

## 2020-09-15 DIAGNOSIS — Z79899 Other long term (current) drug therapy: Secondary | ICD-10-CM

## 2020-09-15 NOTE — Telephone Encounter (Signed)
-----   Message from Imogene Burn, PA-C sent at 09/14/2020  7:51 AM EST ----- Labs stable. One of liver enzymes slightly elevated. Ask if using a lot of tylenol, NSAIDs or alcohol and ask him to reduce. No other changes. Repeat LFT's in 6 months

## 2020-09-16 ENCOUNTER — Inpatient Hospital Stay (HOSPITAL_COMMUNITY): Admission: RE | Admit: 2020-09-16 | Payer: Medicare Other | Source: Ambulatory Visit

## 2020-09-16 ENCOUNTER — Telehealth (HOSPITAL_COMMUNITY): Payer: Self-pay | Admitting: *Deleted

## 2020-09-16 ENCOUNTER — Other Ambulatory Visit: Payer: Self-pay | Admitting: Interventional Cardiology

## 2020-09-16 NOTE — Telephone Encounter (Signed)
Close encounter 

## 2020-09-20 ENCOUNTER — Other Ambulatory Visit: Payer: Self-pay

## 2020-09-20 ENCOUNTER — Ambulatory Visit (HOSPITAL_COMMUNITY)
Admission: RE | Admit: 2020-09-20 | Discharge: 2020-09-20 | Disposition: A | Discharge status: Home / Self Care | Payer: Medicare Other | Source: Ambulatory Visit | Attending: Cardiology | Admitting: Cardiology

## 2020-09-20 DIAGNOSIS — R0609 Other forms of dyspnea: Secondary | ICD-10-CM

## 2020-09-20 DIAGNOSIS — R0600 Dyspnea, unspecified: Secondary | ICD-10-CM | POA: Insufficient documentation

## 2020-09-20 LAB — MYOCARDIAL PERFUSION IMAGING
LV dias vol: 97 mL (ref 62–150)
LV sys vol: 49 mL
Peak HR: 91 {beats}/min
Rest HR: 58 {beats}/min
SDS: 0
SRS: 0
SSS: 0
TID: 1.04

## 2020-09-20 MED ORDER — REGADENOSON 0.4 MG/5ML IV SOLN
0.4000 mg | Freq: Once | INTRAVENOUS | Status: AC
  Administered 2020-09-20: 0.4 mg via INTRAVENOUS

## 2020-09-20 MED ORDER — TECHNETIUM TC 99M TETROFOSMIN IV KIT
29.6000 | PACK | Freq: Once | INTRAVENOUS | Status: AC | PRN
  Administered 2020-09-20: 09:00:00 29.6 via INTRAVENOUS
  Filled 2020-09-20: qty 30

## 2020-09-20 MED ORDER — TECHNETIUM TC 99M TETROFOSMIN IV KIT
9.7000 | PACK | Freq: Once | INTRAVENOUS | Status: AC | PRN
  Administered 2020-09-20: 9.7 via INTRAVENOUS
  Filled 2020-09-20: qty 10

## 2020-10-17 ENCOUNTER — Other Ambulatory Visit: Payer: Self-pay | Admitting: Interventional Cardiology

## 2021-01-15 ENCOUNTER — Other Ambulatory Visit: Payer: Self-pay | Admitting: Interventional Cardiology

## 2021-03-16 ENCOUNTER — Other Ambulatory Visit: Payer: Medicare HMO

## 2021-08-19 ENCOUNTER — Emergency Department: Payer: Medicare HMO

## 2021-08-19 ENCOUNTER — Emergency Department
Admission: EM | Admit: 2021-08-19 | Discharge: 2021-08-19 | Disposition: A | Discharge status: Home / Self Care | Payer: Medicare HMO | Attending: Emergency Medicine | Admitting: Emergency Medicine

## 2021-08-19 ENCOUNTER — Encounter: Payer: Self-pay | Admitting: Emergency Medicine

## 2021-08-19 ENCOUNTER — Other Ambulatory Visit: Payer: Self-pay

## 2021-08-19 DIAGNOSIS — Z7982 Long term (current) use of aspirin: Secondary | ICD-10-CM | POA: Insufficient documentation

## 2021-08-19 DIAGNOSIS — R109 Unspecified abdominal pain: Secondary | ICD-10-CM

## 2021-08-19 DIAGNOSIS — Z7902 Long term (current) use of antithrombotics/antiplatelets: Secondary | ICD-10-CM | POA: Diagnosis not present

## 2021-08-19 DIAGNOSIS — I2510 Atherosclerotic heart disease of native coronary artery without angina pectoris: Secondary | ICD-10-CM | POA: Insufficient documentation

## 2021-08-19 DIAGNOSIS — I10 Essential (primary) hypertension: Secondary | ICD-10-CM | POA: Diagnosis not present

## 2021-08-19 DIAGNOSIS — N200 Calculus of kidney: Secondary | ICD-10-CM | POA: Diagnosis not present

## 2021-08-19 DIAGNOSIS — Z79899 Other long term (current) drug therapy: Secondary | ICD-10-CM | POA: Insufficient documentation

## 2021-08-19 LAB — URINALYSIS, ROUTINE W REFLEX MICROSCOPIC
Bacteria, UA: NONE SEEN
Bilirubin Urine: NEGATIVE
Glucose, UA: >=500 mg/dL — AB
Ketones, ur: NEGATIVE mg/dL
Leukocytes,Ua: NEGATIVE
Nitrite: NEGATIVE
Protein, ur: NEGATIVE mg/dL
RBC / HPF: >50 RBC/hpf — ABNORMAL HIGH (ref 0–5)
Specific Gravity, Urine: 1.02 (ref 1.005–1.030)
pH: 5 (ref 5.0–8.0)

## 2021-08-19 LAB — CBC
HCT: 42 % (ref 39.0–52.0)
Hemoglobin: 15.1 g/dL (ref 13.0–17.0)
MCH: 30.9 pg (ref 26.0–34.0)
MCHC: 36 g/dL (ref 30.0–36.0)
MCV: 86.1 fL (ref 80.0–100.0)
Platelets: 189 10*3/uL (ref 150–400)
RBC: 4.88 MIL/uL (ref 4.22–5.81)
RDW: 11.9 % (ref 11.5–15.5)
WBC: 8.2 10*3/uL (ref 4.0–10.5)
nRBC: 0 % (ref 0.0–0.2)

## 2021-08-19 LAB — BASIC METABOLIC PANEL
Anion gap: 8 (ref 5–15)
BUN: 21 mg/dL (ref 8–23)
CO2: 22 mmol/L (ref 22–32)
Calcium: 8.8 mg/dL — ABNORMAL LOW (ref 8.9–10.3)
Chloride: 105 mmol/L (ref 98–111)
Creatinine, Ser: 1.11 mg/dL (ref 0.61–1.24)
GFR, Estimated: >60 mL/min (ref >60)
Glucose, Bld: 307 mg/dL — ABNORMAL HIGH (ref 70–99)
Potassium: 3.6 mmol/L (ref 3.5–5.1)
Sodium: 135 mmol/L (ref 135–145)

## 2021-08-19 MED ORDER — TAMSULOSIN HCL 0.4 MG PO CAPS: 0.4000 mg | ORAL_CAPSULE | Freq: Every day | ORAL | 0 refills | Status: AC

## 2021-08-19 MED ORDER — OXYCODONE HCL 5 MG PO TABS: 5.0000 mg | ORAL_TABLET | Freq: Four times a day (QID) | ORAL | 0 refills | Status: AC | PRN

## 2021-08-19 MED ORDER — ONDANSETRON HCL 4 MG/2ML IJ SOLN
4.0000 mg | Freq: Once | INTRAMUSCULAR | Status: AC
  Administered 2021-08-19: 4 mg via INTRAVENOUS
  Filled 2021-08-19: qty 2

## 2021-08-19 MED ORDER — ONDANSETRON 4 MG PO TBDP: 4.0000 mg | ORAL_TABLET | Freq: Three times a day (TID) | ORAL | 0 refills | Status: AC | PRN

## 2021-08-19 NOTE — Discharge Instructions (Addendum)
You have a kidney stone. See report below.   Take ibuprofen 600mg  every 8 hours daily (as long as you are not on any other blood thinners or have kidney disease) Take tylenol 1g every 8 hours daily. Take oxycodone for breakthrough pain. Do not drive, work, or operate machinery while on this.  Take zofran to help with nausea. Take Flomax to help dilate uretha. Call urology number above to schedule outpatient appointment. Return to ED for fevers, unable to keep food down, or any other concerns.      IMPRESSION: 1. Right hydroureteronephrosis from a 6 x 4 mm UVJ calculus. 2. 4 mm left renal calculus that is nonobstructive. 3. Atherosclerosis including the coronary arteries.  Take oxycodone as prescribed. Do not drink alcohol, drive or participate in any other potentially dangerous activities while taking this medication as it may make you sleepy. Do not take this medication with any other sedating medications, either prescription or over-the-counter. If you were prescribed Percocet or Vicodin, do not take these with acetaminophen (Tylenol) as it is already contained within these medications.  This medication is an opiate (or narcotic) pain medication and can be habit forming. Use it as little as possible to achieve adequate pain control. Do not use or use it with extreme caution if you have a history of opiate abuse or dependence. If you are on a pain contract with your primary care doctor or a pain specialist, be sure to let them know you were prescribed this medication today from the Emergency Department. This medication is intended for your use only - do not give any to anyone else and keep it in a secure place where nobody else, especially children, have access to it.

## 2021-08-19 NOTE — ED Triage Notes (Signed)
Pt to ED from home c/o right flank pain that woke him up out of his sleep tonight that is sharp, n/v several times, denies hx of kidney stones, denies urinary changes, denies chest pain or SOB.

## 2021-08-19 NOTE — ED Provider Notes (Addendum)
[facility] Emergency Department Provider Note  ____________________________________________   None    (approximate)  I have reviewed the triage vital signs    HISTORY  Chief Complaint Flank Pain    HPI Benjamin Wheeler is a 65 y.o. male who presents with flank pain. Pain started at 1230AM, woke him up, on the R side, did not take anything to help with pain. Nothing made it worse. No h/o kidney stones. No chest pain, no sob. + nausea and vomiting.      Past Medical History:  Diagnosis Date   CAD (coronary artery disease)    S/P STENT TO CIRCUMFLEX IN 2003, DES RCA 2009   Elevated lipids    Gout    Gout    Hyperlipidemia    Hypertension     Patient Active Problem List   Diagnosis Date Noted   Mixed hyperlipidemia 11/30/2013   Coronary atherosclerosis of native coronary artery 11/30/2013   Elevated blood pressure reading without diagnosis of hypertension 11/30/2013    Past Surgical History:  Procedure Laterality Date   CARDIAC CATHETERIZATION     S/P STENT TO CIRCUMFLEX IN 2003, DES RCA 2009   COLONOSCOPY WITH PROPOFOL N/A 05/04/2016   Procedure: COLONOSCOPY WITH PROPOFOL;  Surgeon: [name], MD;  Location: [facility] ENDOSCOPY;  Service: Endoscopy;  Laterality: N/A;   heart stent      Prior to Admission medications   Medication Sig Start Date End Date Taking? Authorizing Provider  amLODipine (NORVASC) 5 MG tablet Take 1 tablet (5 mg total) by mouth daily. 09/13/20 12/12/20  Imogene Burn, PA-C  aspirin EC 81 MG tablet Take 81 mg by mouth daily.    [provider]  atorvastatin (LIPITOR) 40 MG tablet TAKE 1 TABLET BY MOUTH EVERY DAY 01/16/21   Jettie Booze, MD  b complex vitamins capsule Take 1 capsule by mouth daily.    [provider]  Cholecalciferol 1000 UNIT/10ML LIQD Take 1,000 Units by mouth daily.     [provider]  clopidogrel (PLAVIX) 75 MG tablet TAKE 1 TABLET BY MOUTH DAILY WITH  BREAKFAST. 09/16/20   Jettie Booze, MD  Omega-3 Fatty Acids (FISH OIL) 1000 MG CAPS Take 3,000 mg by mouth 2 (two) times daily.    [provider]    Allergies Patient has no known allergies.  Family History  Problem Relation Age of Onset   Hypertension Mother    CAD Father    Hypertension Father    Heart attack Neg Hx    Stroke Neg Hx     Social History Social History   Tobacco Use   Smoking status: Never   Smokeless tobacco: Never  Vaping Use   Vaping Use: Never used  Substance Use Topics   Alcohol use: No   Drug use: No      Review of Systems Constitutional: No fever/chills Eyes: No visual changes. ENT: No sore throat. Cardiovascular: Denies chest pain. Respiratory: no SOB. No cough Gastrointestinal: No abdominal pain.  No nausea, no vomiting.  No diarrhea.  No constipation. Genitourinary: no severe blood in urine Musculoskeletal: + flank pain Skin: Negative for rash. Neurological: Negative for headaches, focal weakness or numbness. All other ROS negative ____________________________________________   PHYSICAL EXAM:  VITAL SIGNS: ED Triage Vitals  Enc Vitals Group     BP 08/19/21 0342 (!) 160/108     Pulse Rate 08/19/21 0342 82     Resp 08/19/21 0342 20  Temp 08/19/21 0342 97.6 F (36.4 C)     Temp Source 08/19/21 0342 Oral     SpO2 08/19/21 0342 96 %     Weight 08/19/21 0339 185 lb (83.9 kg)     Height 08/19/21 0339 5\' 7"  (1.702 m)     Head Circumference --      Peak Flow --      Pain Score 08/19/21 0338 10     Pain Loc --      Pain Edu? --      Excl. in [place]? --     Constitutional: Alert and oriented. Discomfort noted  Eyes: Conjunctivae are normal. EOMI. Head: Atraumatic. Nose: No congestion/rhinnorhea. Mouth/Throat: Mucous membranes are moist.   Neck: No stridor. Trachea Midline. FROM Cardiovascular: Normal rate, regular rhythm.  Good peripheral circulation. Respiratory: no audible stridor, work of breathing   Gastrointestinal: Soft and nontender. No distention.  Musculoskeletal: No lower extremity tenderness nor edema.  No joint effusions. Neurologic:  Normal speech and language. No gross focal neurologic deficits are appreciated.  Skin:  Skin is warm, dry and intact. No rash noted. Psychiatric: Mood and affect are normal. Speech and behavior are normal. GU: Deferred  Flank tenderness.  ____________________________________________   LABS (all labs ordered are listed, but only abnormal results are displayed)  Labs Reviewed  URINALYSIS, ROUTINE W REFLEX MICROSCOPIC - Abnormal; Notable for the following components:      Result Value   Color, Urine YELLOW (*)    APPearance HAZY (*)    Glucose, UA >=500 (*)    Hgb urine dipstick LARGE (*)    RBC / HPF >50 (*)    All other components within normal limits  BASIC METABOLIC PANEL - Abnormal; Notable for the following components:   Glucose, Bld 307 (*)    Calcium 8.8 (*)    All other components within normal limits  CBC   ____________________________________________  RADIOLOGY  Official radiology report(s): CT Renal Stone Study  Result Date: 08/19/2021 CLINICAL DATA:  Right flank pain that awoke the patient from sleep. EXAM: CT ABDOMEN AND PELVIS WITHOUT CONTRAST TECHNIQUE: Multidetector CT imaging of the abdomen and pelvis was performed following the standard protocol without IV contrast. COMPARISON:  None. FINDINGS: Lower chest:  Coronary atherosclerosis. Hepatobiliary: No focal liver abnormality.No evidence of biliary obstruction or stone. Pancreas: Unremarkable. Spleen: Unremarkable. Adrenals/Urinary Tract: Negative adrenals. Right hydroureteronephrosis from a 6 x 4 mm UVJ calculus. 4 mm left interpolar renal calculus. Unremarkable bladder. Stomach/Bowel:  No obstruction. No appendicitis. Vascular/Lymphatic: No acute vascular abnormality. No mass or adenopathy. Reproductive:No pathologic findings. Other: No ascites or pneumoperitoneum.  Musculoskeletal: No acute abnormalities. Lumbar spine degeneration with scoliosis. Remote L3 compression fracture. IMPRESSION: 1. Right hydroureteronephrosis from a 6 x 4 mm UVJ calculus. 2. 4 mm left renal calculus that is nonobstructive. 3. Atherosclerosis including the coronary arteries. Electronically Signed   By: Jorje Guild M.D.   On: 08/19/2021 04:28    ____________________________________________   PROCEDURES  Procedure(s) performed (including Critical Care):  Procedures   ____________________________________________   INITIAL IMPRESSION / ASSESSMENT AND PLAN / ED COURSE  KEDARIUS ALOISI was evaluated in Emergency Department on 08/19/2021 for the symptoms described in the history of present illness. He was evaluated in the context of the global COVID-19 pandemic, which necessitated consideration that the patient might be at risk for infection with the SARS-CoV-2 virus that causes COVID-19. Institutional protocols and algorithms that pertain to the evaluation of patients at risk for COVID-19 are in a  state of rapid change based on information released by regulatory bodies including the CDC and federal and state organizations. These policies and algorithms were followed during the patient's care in the ED.     Pt presents with flank pain.  Suspect this is most likely kidney stone. Will get labs to evaluate for electrolyte abnormalities/AKI.  CT scan ordered to evaluate for kidney stone.  Also consider appendicitis vs SBO although less likely given description of pain.  Will get UA to evaluate for UTI/pyelo.  Pt seen from triage due to no rooms available= pt states pain is 1/10 and okay with dc home from triage. No nausea at this time.    CT confirms kidney stone.  No evidence of infected stone. No fevers. UA re-assuring.   Reviewed labs--No severe AKI.   Sugar in 300s--- h/o pre diabetes d/w pt concern he may have new onset diabetes and pt will f/u with PCP holding on  metformin now due to concern it will further gi upset and will need test to confirm.    D/w pt rx with ibuprofen/tylenol and oxycodone for breath though pain.  Understands not to drive or work while on oxycodone.  Given zofran/flomax and urology number for follow up.  Pt expressed understanding and felt comfortable with dc home.        ____________________________________________   FINAL CLINICAL IMPRESSION(S) / ED DIAGNOSES   Final diagnoses:  Right flank pain  Kidney stone      MEDICATIONS GIVEN DURING THIS VISIT:  Medications  ondansetron (ZOFRAN) injection 4 mg (4 mg Intravenous Given 08/19/21 0354)     ED Discharge Orders     None        Note:  This document was prepared using Dragon voice recognition software and may include unintentional dictation errors.   Vanessa Short Pump, MD 08/19/21 1610    Vanessa Bee, MD 08/19/21 [phone]

## 2021-08-30 ENCOUNTER — Other Ambulatory Visit: Payer: Self-pay | Admitting: *Deleted

## 2021-08-30 DIAGNOSIS — N200 Calculus of kidney: Secondary | ICD-10-CM

## 2021-08-30 NOTE — Progress Notes (Signed)
kub

## 2021-09-14 ENCOUNTER — Other Ambulatory Visit: Payer: Self-pay | Admitting: Physician Assistant

## 2021-10-07 ENCOUNTER — Other Ambulatory Visit: Payer: Self-pay | Admitting: Interventional Cardiology

## 2021-10-10 NOTE — Telephone Encounter (Signed)
I spoke with patient. He is no longer seeing Dr Irish Lack.  He is seeing Dr Nehemiah Massed at [facility].  Refill denied

## 2021-11-25 IMAGING — CT CT RENAL STONE PROTOCOL
2 of 4 series · 16 of 46 positions shown, 18 images · non-contrast
Comparison: None.

CLINICAL DATA: Right flank pain that awoke the patient from sleep.

EXAM:
CT ABDOMEN AND PELVIS WITHOUT CONTRAST
TECHNIQUE: Multidetector CT imaging of the abdomen and pelvis was performed
following the standard protocol without IV contrast.

[Series 2: stone full standard · axial · 0.81mm/px · z∈[-926,-466]mm · 13 of 100 slices shown, 15 images]
[im 4/100  soft-tissue]
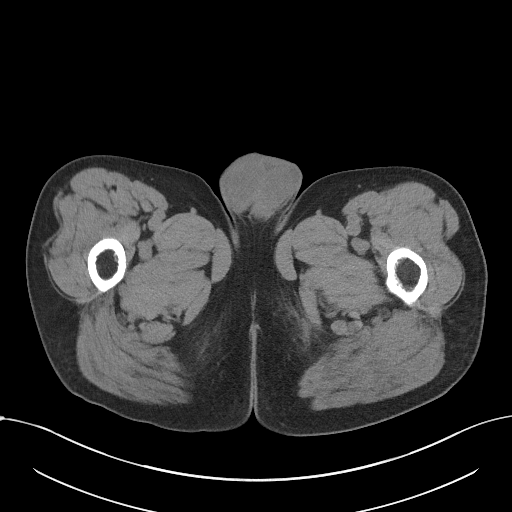
[im 4/100  bone]
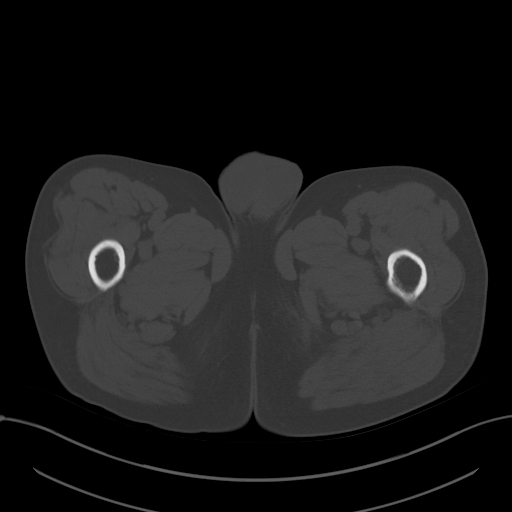
[im 12/100  soft-tissue]
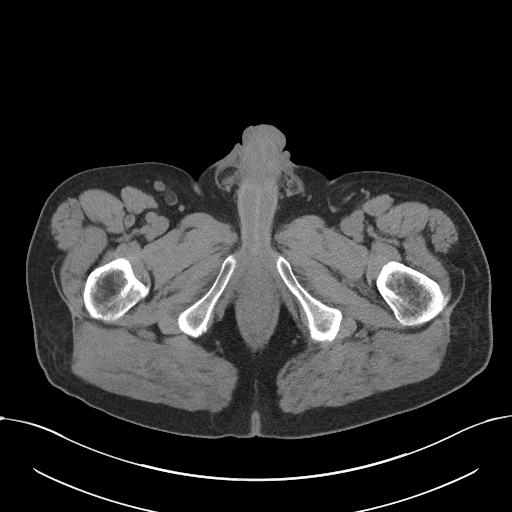
[im 20/100  soft-tissue]
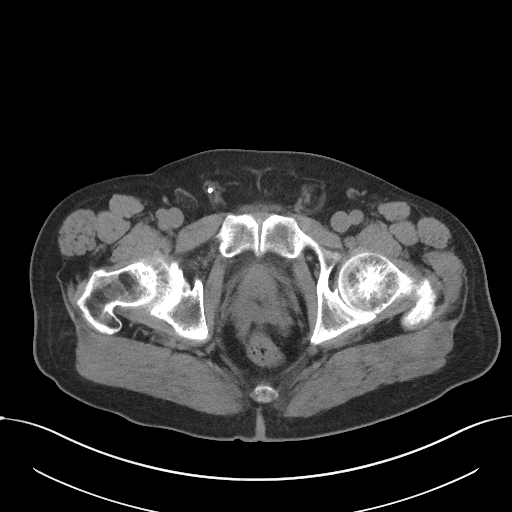
[im 28/100  soft-tissue]
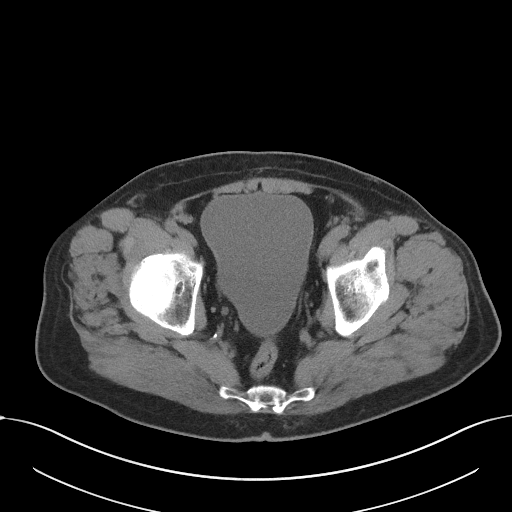
[im 36/100  soft-tissue]
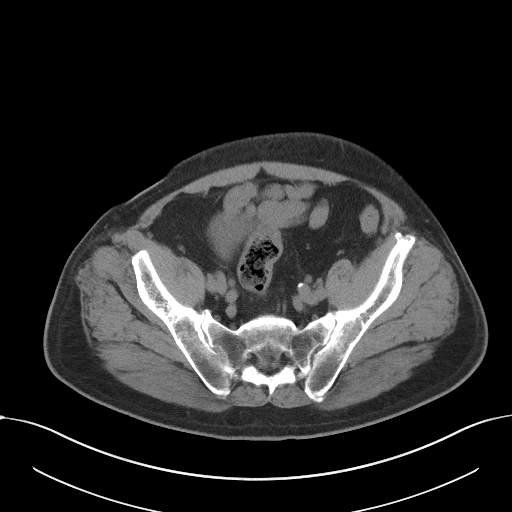
[im 44/100  soft-tissue]
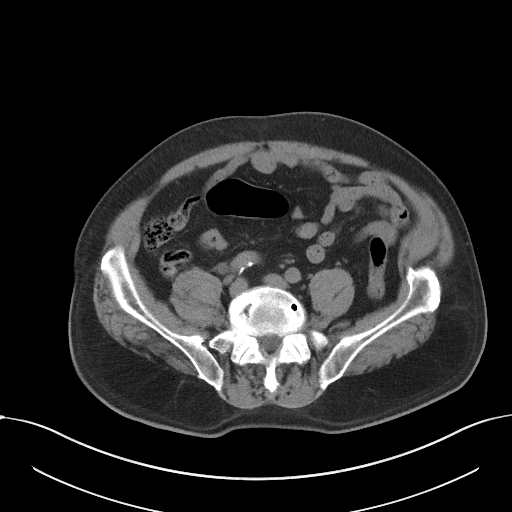
[im 52/100  soft-tissue]
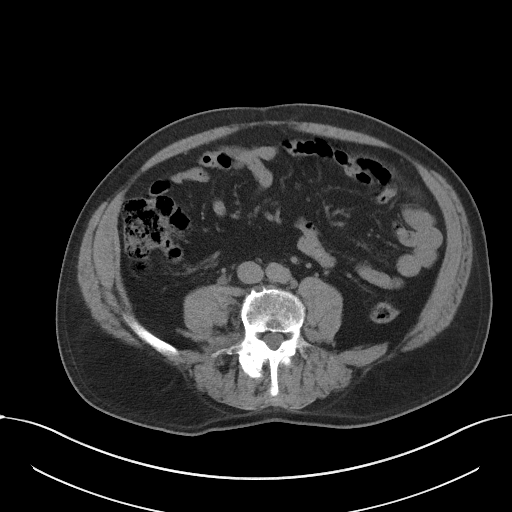
[im 56/100  soft-tissue]
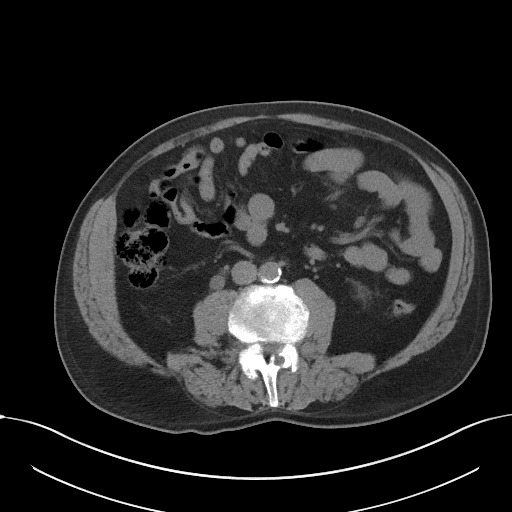
[im 64/100  soft-tissue]
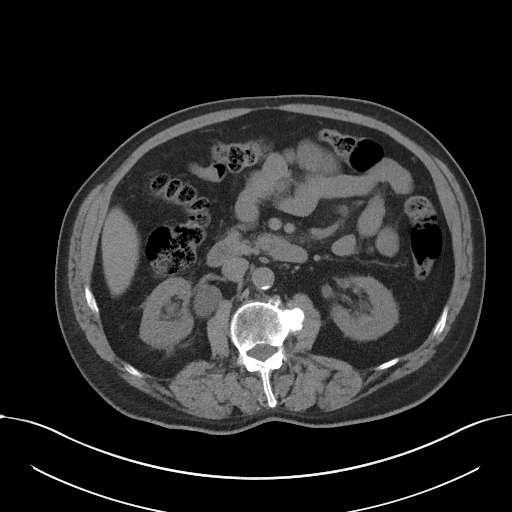
[im 64/100  bone]
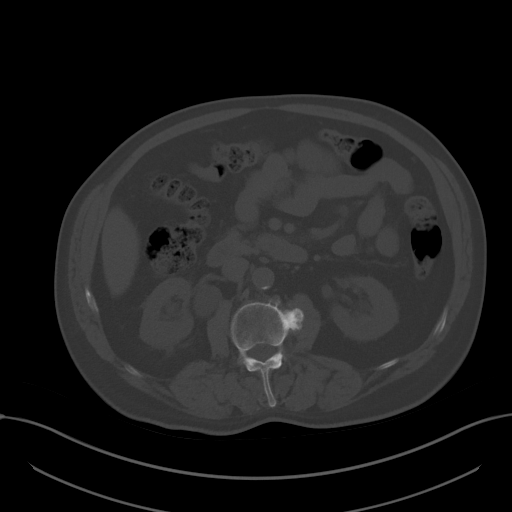
[im 72/100  soft-tissue]
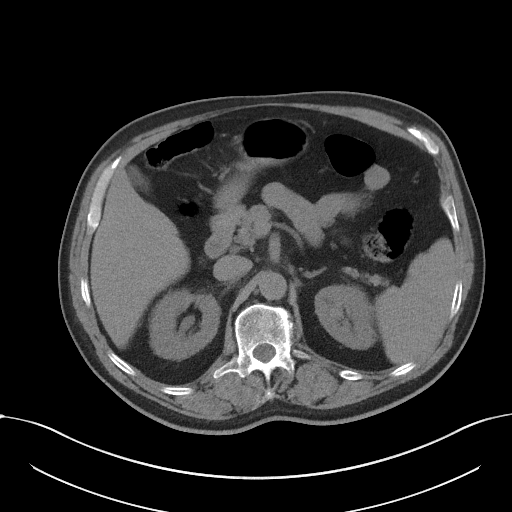
[im 80/100  soft-tissue]
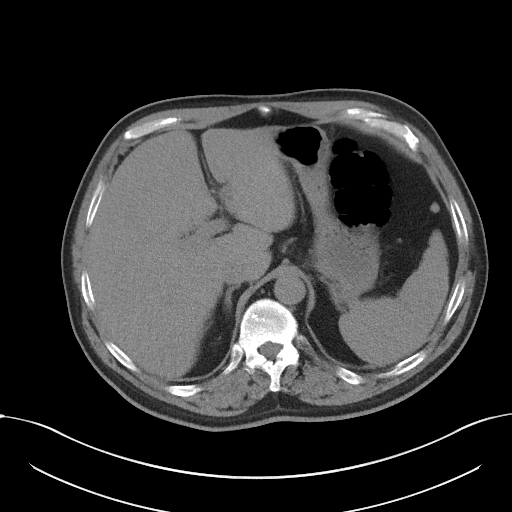
[im 88/100  soft-tissue]
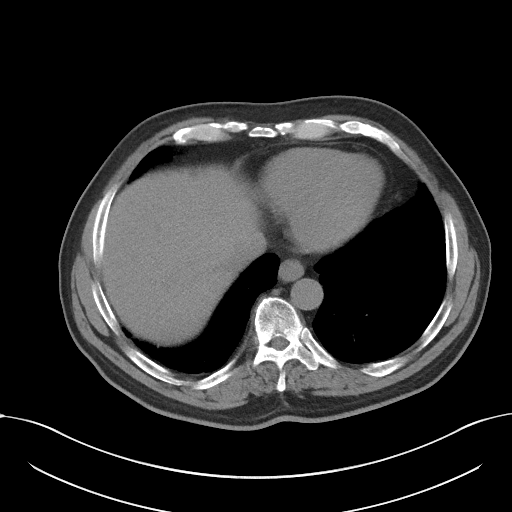
[im 96/100  soft-tissue]
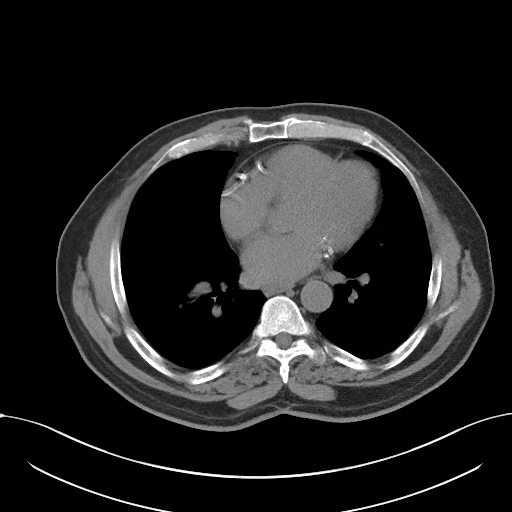

[Series 5: coronal · coronal · 0.76mm/px · 3 of 145 slices shown]
[im 49/145  soft-tissue]
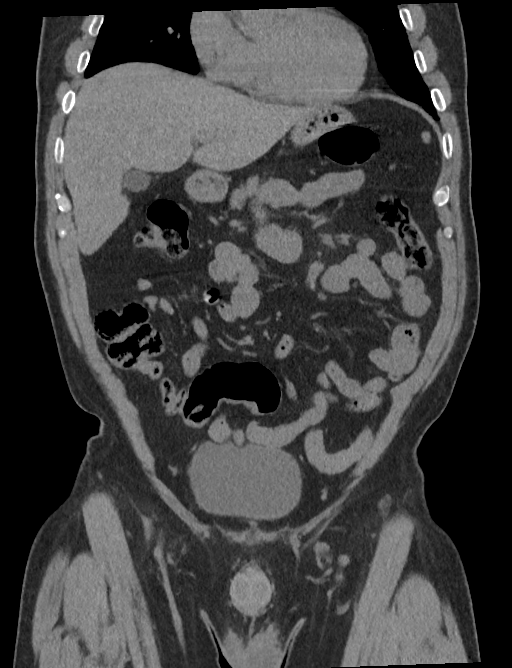
[im 65/145  soft-tissue]
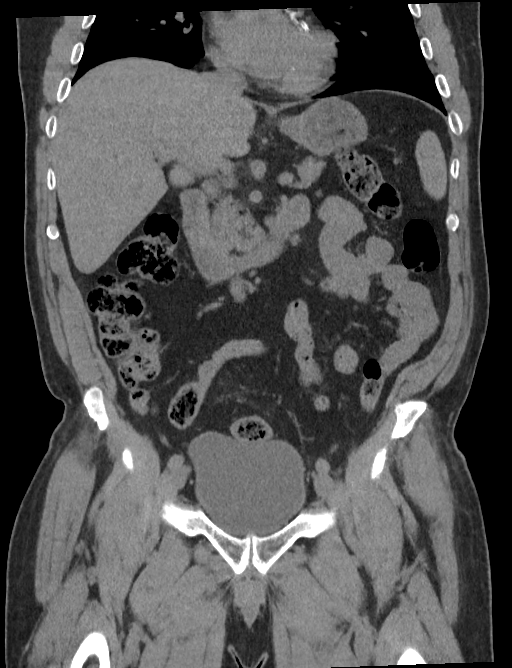
[im 81/145  soft-tissue]
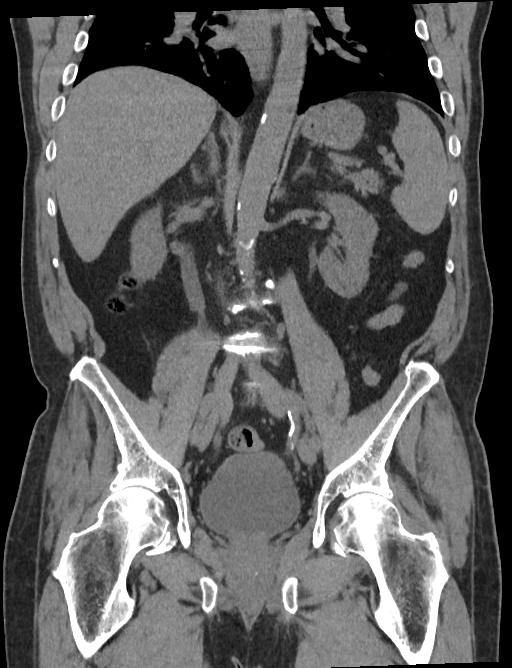

[16 of 46 positions shown; findings below may reference images not displayed]

FINDINGS: Lower chest:  Coronary atherosclerosis.

Hepatobiliary: No focal liver abnormality.No evidence of biliary
obstruction or stone.

Pancreas: Unremarkable.

Spleen: Unremarkable.

Adrenals/Urinary Tract: Negative adrenals. Right
hydroureteronephrosis from a 6 x 4 mm UVJ calculus. 4 mm left
interpolar renal calculus. Unremarkable bladder.

Stomach/Bowel:  No obstruction. No appendicitis.

Vascular/Lymphatic: No acute vascular abnormality. No mass or
adenopathy.

Reproductive:No pathologic findings.

Other: No ascites or pneumoperitoneum.

Musculoskeletal: No acute abnormalities. Lumbar spine degeneration
with scoliosis. Remote L3 compression fracture.
IMPRESSION: 1. Right hydroureteronephrosis from a 6 x 4 mm UVJ calculus.
2. 4 mm left renal calculus that is nonobstructive.
3. Atherosclerosis including the coronary arteries.

## 2023-01-25 ENCOUNTER — Ambulatory Visit: Payer: Medicare HMO

## 2023-01-25 DIAGNOSIS — Z800 Family history of malignant neoplasm of digestive organs: Secondary | ICD-10-CM | POA: Diagnosis not present

## 2023-01-25 DIAGNOSIS — Z1211 Encounter for screening for malignant neoplasm of colon: Secondary | ICD-10-CM | POA: Diagnosis not present

## 2023-01-25 DIAGNOSIS — Z83719 Family history of colon polyps, unspecified: Secondary | ICD-10-CM | POA: Diagnosis not present
